# Patient Record
Sex: Female | Born: 1945 | Race: White | Hispanic: No | Marital: Married | Smoking: Never smoker
Health system: Southern US, Community
[De-identification: ages and names within clinical notes are randomized; demographics above are authoritative.]

## PROBLEM LIST (undated history)

## (undated) DIAGNOSIS — A809 Acute poliomyelitis, unspecified: Secondary | ICD-10-CM

## (undated) DIAGNOSIS — M1990 Unspecified osteoarthritis, unspecified site: Secondary | ICD-10-CM

## (undated) DIAGNOSIS — C801 Malignant (primary) neoplasm, unspecified: Secondary | ICD-10-CM

## (undated) DIAGNOSIS — I10 Essential (primary) hypertension: Secondary | ICD-10-CM

## (undated) HISTORY — PX: OTHER SURGICAL HISTORY: SHX169

## (undated) HISTORY — PX: CARDIAC SURGERY: SHX584

## (undated) HISTORY — PX: CATARACT EXTRACTION: SUR2

---

## 2019-10-01 ENCOUNTER — Emergency Department
Admission: EM | Admit: 2019-10-01 | Discharge: 2019-10-02 | Disposition: A | Discharge status: Other Acute Inpatient Hospital | Payer: Medicare Other | Attending: Student in an Organized Health Care Education/Training Program | Admitting: Student in an Organized Health Care Education/Training Program

## 2019-10-01 ENCOUNTER — Emergency Department: Payer: Medicare Other

## 2019-10-01 ENCOUNTER — Other Ambulatory Visit: Payer: Self-pay

## 2019-10-01 DIAGNOSIS — S8992XA Unspecified injury of left lower leg, initial encounter: Secondary | ICD-10-CM | POA: Diagnosis present

## 2019-10-01 DIAGNOSIS — S72492A Other fracture of lower end of left femur, initial encounter for closed fracture: Secondary | ICD-10-CM | POA: Diagnosis not present

## 2019-10-01 DIAGNOSIS — Z20828 Contact with and (suspected) exposure to other viral communicable diseases: Secondary | ICD-10-CM | POA: Insufficient documentation

## 2019-10-01 DIAGNOSIS — Y999 Unspecified external cause status: Secondary | ICD-10-CM | POA: Insufficient documentation

## 2019-10-01 DIAGNOSIS — Z85528 Personal history of other malignant neoplasm of kidney: Secondary | ICD-10-CM | POA: Diagnosis not present

## 2019-10-01 DIAGNOSIS — W010XXA Fall on same level from slipping, tripping and stumbling without subsequent striking against object, initial encounter: Secondary | ICD-10-CM | POA: Insufficient documentation

## 2019-10-01 DIAGNOSIS — I10 Essential (primary) hypertension: Secondary | ICD-10-CM | POA: Insufficient documentation

## 2019-10-01 DIAGNOSIS — Y9389 Activity, other specified: Secondary | ICD-10-CM | POA: Insufficient documentation

## 2019-10-01 DIAGNOSIS — Y92009 Unspecified place in unspecified non-institutional (private) residence as the place of occurrence of the external cause: Secondary | ICD-10-CM | POA: Insufficient documentation

## 2019-10-01 DIAGNOSIS — S72402A Unspecified fracture of lower end of left femur, initial encounter for closed fracture: Secondary | ICD-10-CM | POA: Insufficient documentation

## 2019-10-01 HISTORY — DX: Malignant (primary) neoplasm, unspecified: C80.1

## 2019-10-01 HISTORY — DX: Unspecified osteoarthritis, unspecified site: M19.90

## 2019-10-01 HISTORY — DX: Essential (primary) hypertension: I10

## 2019-10-01 HISTORY — DX: Acute poliomyelitis, unspecified: A80.9

## 2019-10-01 LAB — TROPONIN I (HIGH SENSITIVITY)
Troponin I (High Sensitivity): 6 ng/L (ref <18)
Troponin I (High Sensitivity): 8 ng/L (ref <18)

## 2019-10-01 LAB — CBC WITH DIFFERENTIAL/PLATELET
Abs Immature Granulocytes: 0.01 10*3/uL (ref 0.00–0.07)
Basophils Absolute: 0 10*3/uL (ref 0.0–0.1)
Basophils Relative: 0 %
Eosinophils Absolute: 0.2 10*3/uL (ref 0.0–0.5)
Eosinophils Relative: 2 %
HCT: 47.7 % — ABNORMAL HIGH (ref 36.0–46.0)
Hemoglobin: 15.2 g/dL — ABNORMAL HIGH (ref 12.0–15.0)
Immature Granulocytes: 0 %
Lymphocytes Relative: 26 %
Lymphs Abs: 2.1 10*3/uL (ref 0.7–4.0)
MCH: 29.7 pg (ref 26.0–34.0)
MCHC: 31.9 g/dL (ref 30.0–36.0)
MCV: 93.3 fL (ref 80.0–100.0)
Monocytes Absolute: 0.7 10*3/uL (ref 0.1–1.0)
Monocytes Relative: 9 %
Neutro Abs: 5 10*3/uL (ref 1.7–7.7)
Neutrophils Relative %: 63 %
Platelets: 144 10*3/uL — ABNORMAL LOW (ref 150–400)
RBC: 5.11 MIL/uL (ref 3.87–5.11)
RDW: 13.1 % (ref 11.5–15.5)
WBC: 8 10*3/uL (ref 4.0–10.5)
nRBC: 0 % (ref 0.0–0.2)

## 2019-10-01 LAB — RESPIRATORY PANEL BY RT PCR (FLU A&B, COVID)
Influenza A by PCR: NEGATIVE
Influenza B by PCR: NEGATIVE
SARS Coronavirus 2 by RT PCR: NEGATIVE

## 2019-10-01 LAB — BASIC METABOLIC PANEL
Anion gap: 12 (ref 5–15)
BUN: 23 mg/dL (ref 8–23)
CO2: 24 mmol/L (ref 22–32)
Calcium: 10.4 mg/dL — ABNORMAL HIGH (ref 8.9–10.3)
Chloride: 105 mmol/L (ref 98–111)
Creatinine, Ser: 0.86 mg/dL (ref 0.44–1.00)
GFR calc Af Amer: >60 mL/min (ref >60)
GFR calc non Af Amer: >60 mL/min (ref >60)
Glucose, Bld: 124 mg/dL — ABNORMAL HIGH (ref 70–99)
Potassium: 4.2 mmol/L (ref 3.5–5.1)
Sodium: 141 mmol/L (ref 135–145)

## 2019-10-01 MED ORDER — MORPHINE SULFATE (PF) 4 MG/ML IV SOLN
4.0000 mg | INTRAVENOUS | Status: DC | PRN
  Administered 2019-10-01: 4 mg via INTRAVENOUS
  Filled 2019-10-01: qty 1

## 2019-10-01 MED ORDER — ONDANSETRON 4 MG PO TBDP
4.0000 mg | ORAL_TABLET | Freq: Once | ORAL | Status: AC
  Administered 2019-10-01: 4 mg via ORAL
  Filled 2019-10-01: qty 1

## 2019-10-01 MED ORDER — HYDROMORPHONE HCL 1 MG/ML IJ SOLN
0.5000 mg | Freq: Once | INTRAMUSCULAR | Status: AC
  Administered 2019-10-01: 0.5 mg via INTRAVENOUS
  Filled 2019-10-01: qty 1

## 2019-10-01 NOTE — Plan of Care (Signed)
Case discussed with Dr. Quentin Cornwall in ED and patient accepted to hospitalist service at [facility].     Patient is a 76 yof with hx of RCC s/p left nephrectomy in 2016, childhood polio, and hypertension who presented with left knee pain and inability to bear weight after a mechanical fall.   She had been in her usual state before the fall. Found to have distal left femur fracture. There were no anginal complaints but non-specific repolarization abnormalities noted on EKG with no priors available and so HS troponin was checked and normal.   Ortho was consulted by ED physician and Dr. Lucia Gaskins requested admission to hospitalist service and indicated plan for OR on 12/31.

## 2019-10-01 NOTE — ED Triage Notes (Signed)
Pt to ED via EMS from home. Pt arrives post mechanical fall, pt c/o left knee pain. Pt was unable to bear weight on extremity after fall. Pt has hx of polio, with deformity of left leg (shortneing and rotation) no new deformity noted per pt. Per ems pts bp was 201/176 on arrival, pt currently at 190/84.

## 2019-10-01 NOTE — ED Provider Notes (Signed)
[facility] Emergency Department Provider Note    First MD Initiated Contact with Patient 10/01/19 1952     (approximate)  I have reviewed the triage vital signs and the nursing notes.   HISTORY  Chief Complaint Fall    HPI Monica Garrison is a 73 y.o. female listed past medical history presents the ER after mechanical fall.  States she was getting up at her desk to use restroom and tripped over a knitting bag falling landing on her left knee.  Did not hit her head.  Says the pain is mild to moderate in severity.  Is not any blood thinners.  Was unable to bear weight secondary to pain.    Past Medical History:  Diagnosis Date  . Arthritis   . Cancer [facility])    renal cancer  . Hypertension   . Polio    No family history on file.  Patient Active Problem List   Diagnosis Date Noted  . Closed fracture of left distal femur ([place])       Prior to Admission medications   Not on File    Allergies Heparin    Social History Social History   Tobacco Use  . Smoking status: Never Smoker  . Smokeless tobacco: Never Used  Substance Use Topics  . Alcohol use: Never  . Drug use: Never    Review of Systems Patient denies headaches, rhinorrhea, blurry vision, numbness, shortness of breath, chest pain, edema, cough, abdominal pain, nausea, vomiting, diarrhea, dysuria, fevers, rashes or hallucinations unless otherwise stated above in HPI. ____________________________________________   PHYSICAL EXAM:  VITAL SIGNS: Vitals:   10/01/19 2134 10/01/19 2230  BP: (!) 164/71 (!) 167/81  Pulse: 64 65  Resp: 20 13  Temp:    SpO2: 99% 95%    Constitutional: Alert and oriented.  Eyes: Conjunctivae are normal.  Head: Atraumatic. Nose: No congestion/rhinnorhea. Mouth/Throat: Mucous membranes are moist.   Neck: No stridor. Painless ROM.  Cardiovascular: Normal rate, regular rhythm. Grossly normal heart sounds.  Good peripheral  circulation. Respiratory: Normal respiratory effort.  No retractions. Lungs CTAB. Gastrointestinal: Soft and nontender. No distention. No abdominal bruits. No CVA tenderness. Genitourinary:  Musculoskeletal: rle non tender.  lle with knee effusion, and proximal contusion with valgus deformity,  Compartments soft and compressible.  Strong PD and PT pulse BLE.  Sensation intact Neurologic:  Normal speech and language. No gross focal neurologic deficits are appreciated. No facial droop Skin:  Skin is warm, dry and intact. No rash noted. Psychiatric: Mood and affect are normal. Speech and behavior are normal.  ____________________________________________   LABS (all labs ordered are listed, but only abnormal results are displayed)  Results for orders placed or performed during the hospital encounter of 10/01/19 (from the past 24 hour(s))  CBC with Differential     Status: Abnormal   Collection Time: 10/01/19  7:50 PM  Result Value Ref Range   WBC 8.0 4.0 - 10.5 K/uL   RBC 5.11 3.87 - 5.11 MIL/uL   Hemoglobin 15.2 (H) 12.0 - 15.0 g/dL   HCT 47.7 (H) 36.0 - 46.0 %   MCV 93.3 80.0 - 100.0 fL   MCH 29.7 26.0 - 34.0 pg   MCHC 31.9 30.0 - 36.0 g/dL   RDW 13.1 11.5 - 15.5 %   Platelets 144 (L) 150 - 400 K/uL   nRBC 0.0 0.0 - 0.2 %   Neutrophils Relative % 63 %   Neutro Abs 5.0 1.7 - 7.7 K/uL  Lymphocytes Relative 26 %   Lymphs Abs 2.1 0.7 - 4.0 K/uL   Monocytes Relative 9 %   Monocytes Absolute 0.7 0.1 - 1.0 K/uL   Eosinophils Relative 2 %   Eosinophils Absolute 0.2 0.0 - 0.5 K/uL   Basophils Relative 0 %   Basophils Absolute 0.0 0.0 - 0.1 K/uL   Immature Granulocytes 0 %   Abs Immature Granulocytes 0.01 0.00 - 0.07 K/uL  Basic metabolic panel     Status: Abnormal   Collection Time: 10/01/19  7:50 PM  Result Value Ref Range   Sodium 141 135 - 145 mmol/L   Potassium 4.2 3.5 - 5.1 mmol/L   Chloride 105 98 - 111 mmol/L   CO2 24 22 - 32 mmol/L   Glucose, Bld 124 (H) 70 - 99 mg/dL    BUN 23 8 - 23 mg/dL   Creatinine, Ser 0.86 0.44 - 1.00 mg/dL   Calcium 10.4 (H) 8.9 - 10.3 mg/dL   GFR calc non Af Amer >60 >60 mL/min   GFR calc Af Amer >60 >60 mL/min   Anion gap 12 5 - 15  Troponin I (High Sensitivity)     Status: None   Collection Time: 10/01/19  7:50 PM  Result Value Ref Range   Troponin I (High Sensitivity) 6 <18 ng/L  Troponin I (High Sensitivity)     Status: None   Collection Time: 10/01/19  9:10 PM  Result Value Ref Range   Troponin I (High Sensitivity) 8 <18 ng/L  Respiratory Panel by RT PCR (Flu A&B, Covid) - Nasopharyngeal Swab     Status: None   Collection Time: 10/01/19 10:09 PM   Specimen: Nasopharyngeal Swab  Result Value Ref Range   SARS Coronavirus 2 by RT PCR NEGATIVE NEGATIVE   Influenza A by PCR NEGATIVE NEGATIVE   Influenza B by PCR NEGATIVE NEGATIVE   ____________________________________________  EKG My review and personal interpretation at Time: 19:40   Indication: fall  Rate: 60  Rhythm: sinus Axis: normal Other: lateral t wave inversions ____________________________________________  RADIOLOGY  I personally reviewed all radiographic images ordered to evaluate for the above acute complaints and reviewed radiology reports and findings.  These findings were personally discussed with the patient.  Please see medical record for radiology report.  ____________________________________________   PROCEDURES  Procedure(s) performed:  Procedures    Critical Care performed: no ____________________________________________   INITIAL IMPRESSION / ASSESSMENT AND PLAN / ED COURSE  Pertinent labs & imaging results that were available during my care of the patient were reviewed by me and considered in my medical decision making (see chart for details).   DDX: fracture, dislocation, contusion, dislocation  Monica Garrison is a 73 y.o. who presents to the ED with injury to the left knee he has described above.  Compartments soft.  Good  distal perfusion.  X-ray will be ordered for above differential. The patient will be placed on continuous pulse oximetry and telemetry for monitoring.  Laboratory evaluation will be sent to evaluate for the above complaints.     Clinical Course as of Oct 01 2323  Wed Oct 01, 2019  2114 Discussed case in consultation with Dr. Posey Pronto of orthopedics.  Given the intra-articular involvement concern patient will require transfer to facility with dedicated Ortho trauma.   [PR]    Clinical Course User Index [PR] Merlyn Lot, MD    The patient was evaluated in Emergency Department today for the symptoms described in the history of present illness. He/she  was evaluated in the context of the global COVID-19 pandemic, which necessitated consideration that the patient might be at risk for infection with the SARS-CoV-2 virus that causes COVID-19. Institutional protocols and algorithms that pertain to the evaluation of patients at risk for COVID-19 are in a state of rapid change based on information released by regulatory bodies including the CDC and federal and state organizations. These policies and algorithms were followed during the patient's care in the ED.  As part of my medical decision making, I reviewed the following data within the [place] notes reviewed and incorporated, Labs reviewed, notes from prior ED visits and [place] Controlled Substance Database   ____________________________________________   FINAL CLINICAL IMPRESSION(S) / ED DIAGNOSES  Final diagnoses:  Other closed fracture of distal end of left femur, initial encounter ([place])      NEW MEDICATIONS STARTED DURING THIS VISIT:  New Prescriptions   No medications on file     Note:  This document was prepared using Dragon voice recognition software and may include unintentional dictation errors.    Merlyn Lot, MD 10/01/19 [phone]

## 2019-10-01 NOTE — ED Notes (Signed)
PT GIVES VERBAL CONSENT FOR TRANSFER

## 2019-10-01 NOTE — ED Notes (Signed)
Patient transported to X-ray 

## 2019-10-02 ENCOUNTER — Inpatient Hospital Stay (HOSPITAL_COMMUNITY): Payer: Medicare Other | Admitting: Registered Nurse

## 2019-10-02 ENCOUNTER — Inpatient Hospital Stay (HOSPITAL_COMMUNITY): Payer: Medicare Other

## 2019-10-02 ENCOUNTER — Inpatient Hospital Stay (HOSPITAL_COMMUNITY)
Admission: AD | Admit: 2019-10-02 | Discharge: 2019-10-07 | DRG: 481 | Disposition: A | Discharge status: Skilled Nursing Facility | Payer: Medicare Other | Source: Other Acute Inpatient Hospital | Attending: Internal Medicine | Admitting: Internal Medicine

## 2019-10-02 ENCOUNTER — Encounter (HOSPITAL_COMMUNITY): Payer: Self-pay | Admitting: Family Medicine

## 2019-10-02 ENCOUNTER — Encounter (HOSPITAL_COMMUNITY)
Admission: AD | Disposition: A | Discharge status: Skilled Nursing Facility | Payer: Self-pay | Attending: Internal Medicine

## 2019-10-02 DIAGNOSIS — W010XXA Fall on same level from slipping, tripping and stumbling without subsequent striking against object, initial encounter: Secondary | ICD-10-CM | POA: Diagnosis present

## 2019-10-02 DIAGNOSIS — S72402A Unspecified fracture of lower end of left femur, initial encounter for closed fracture: Secondary | ICD-10-CM

## 2019-10-02 DIAGNOSIS — S7292XD Unspecified fracture of left femur, subsequent encounter for closed fracture with routine healing: Secondary | ICD-10-CM | POA: Diagnosis not present

## 2019-10-02 DIAGNOSIS — M25552 Pain in left hip: Secondary | ICD-10-CM | POA: Diagnosis present

## 2019-10-02 DIAGNOSIS — S82002A Unspecified fracture of left patella, initial encounter for closed fracture: Secondary | ICD-10-CM | POA: Diagnosis present

## 2019-10-02 DIAGNOSIS — Z953 Presence of xenogenic heart valve: Secondary | ICD-10-CM | POA: Diagnosis not present

## 2019-10-02 DIAGNOSIS — I160 Hypertensive urgency: Secondary | ICD-10-CM | POA: Diagnosis present

## 2019-10-02 DIAGNOSIS — G14 Postpolio syndrome: Secondary | ICD-10-CM | POA: Diagnosis not present

## 2019-10-02 DIAGNOSIS — Z905 Acquired absence of kidney: Secondary | ICD-10-CM

## 2019-10-02 DIAGNOSIS — S72422A Displaced fracture of lateral condyle of left femur, initial encounter for closed fracture: Principal | ICD-10-CM | POA: Diagnosis present

## 2019-10-02 DIAGNOSIS — Z79899 Other long term (current) drug therapy: Secondary | ICD-10-CM | POA: Diagnosis not present

## 2019-10-02 DIAGNOSIS — E559 Vitamin D deficiency, unspecified: Secondary | ICD-10-CM | POA: Diagnosis present

## 2019-10-02 DIAGNOSIS — Z01818 Encounter for other preprocedural examination: Secondary | ICD-10-CM

## 2019-10-02 DIAGNOSIS — Z20822 Contact with and (suspected) exposure to covid-19: Secondary | ICD-10-CM | POA: Diagnosis present

## 2019-10-02 DIAGNOSIS — D62 Acute posthemorrhagic anemia: Secondary | ICD-10-CM | POA: Diagnosis not present

## 2019-10-02 DIAGNOSIS — Y92009 Unspecified place in unspecified non-institutional (private) residence as the place of occurrence of the external cause: Secondary | ICD-10-CM | POA: Diagnosis not present

## 2019-10-02 DIAGNOSIS — I10 Essential (primary) hypertension: Secondary | ICD-10-CM

## 2019-10-02 DIAGNOSIS — Z85528 Personal history of other malignant neoplasm of kidney: Secondary | ICD-10-CM | POA: Diagnosis not present

## 2019-10-02 DIAGNOSIS — Z419 Encounter for procedure for purposes other than remedying health state, unspecified: Secondary | ICD-10-CM

## 2019-10-02 DIAGNOSIS — T148XXA Other injury of unspecified body region, initial encounter: Secondary | ICD-10-CM

## 2019-10-02 HISTORY — PX: ORIF FEMUR FRACTURE: SHX2119

## 2019-10-02 LAB — BASIC METABOLIC PANEL
Anion gap: 7 (ref 5–15)
BUN: 21 mg/dL (ref 8–23)
CO2: 27 mmol/L (ref 22–32)
Calcium: 10 mg/dL (ref 8.9–10.3)
Chloride: 106 mmol/L (ref 98–111)
Creatinine, Ser: 0.88 mg/dL (ref 0.44–1.00)
GFR calc Af Amer: >60 mL/min (ref >60)
GFR calc non Af Amer: >60 mL/min (ref >60)
Glucose, Bld: 152 mg/dL — ABNORMAL HIGH (ref 70–99)
Potassium: 4.6 mmol/L (ref 3.5–5.1)
Sodium: 140 mmol/L (ref 135–145)

## 2019-10-02 LAB — CBC
HCT: 40.7 % (ref 36.0–46.0)
Hemoglobin: 13.4 g/dL (ref 12.0–15.0)
MCH: 30.8 pg (ref 26.0–34.0)
MCHC: 32.9 g/dL (ref 30.0–36.0)
MCV: 93.6 fL (ref 80.0–100.0)
Platelets: 148 10*3/uL — ABNORMAL LOW (ref 150–400)
RBC: 4.35 MIL/uL (ref 3.87–5.11)
RDW: 13.1 % (ref 11.5–15.5)
WBC: 10.6 10*3/uL — ABNORMAL HIGH (ref 4.0–10.5)
nRBC: 0 % (ref 0.0–0.2)

## 2019-10-02 LAB — ABO/RH: ABO/RH(D): O POS

## 2019-10-02 LAB — TYPE AND SCREEN
ABO/RH(D): O POS
Antibody Screen: NEGATIVE

## 2019-10-02 LAB — MRSA PCR SCREENING: MRSA by PCR: NEGATIVE

## 2019-10-02 SURGERY — OPEN REDUCTION INTERNAL FIXATION (ORIF) DISTAL FEMUR FRACTURE
Anesthesia: General | Laterality: Left

## 2019-10-02 MED ORDER — INFLUENZA VAC A&B SA ADJ QUAD 0.5 ML IM PRSY
0.5000 mL | PREFILLED_SYRINGE | INTRAMUSCULAR | Status: DC
  Filled 2019-10-02: qty 0.5

## 2019-10-02 MED ORDER — PROPOFOL 10 MG/ML IV BOLUS
INTRAVENOUS | Status: DC | PRN
  Administered 2019-10-02: 100 mg via INTRAVENOUS
  Administered 2019-10-02: 50 mg via INTRAVENOUS

## 2019-10-02 MED ORDER — CHLORHEXIDINE GLUCONATE 4 % EX LIQD: 60.0000 mL | Freq: Once | CUTANEOUS | Status: DC

## 2019-10-02 MED ORDER — VANCOMYCIN HCL 1000 MG IV SOLR
INTRAVENOUS | Status: DC | PRN
  Administered 2019-10-02: 1000 mg via TOPICAL

## 2019-10-02 MED ORDER — APIXABAN 2.5 MG PO TABS
2.5000 mg | ORAL_TABLET | Freq: Two times a day (BID) | ORAL | Status: DC
  Administered 2019-10-03 – 2019-10-07 (×9): 2.5 mg via ORAL
  Filled 2019-10-02 (×9): qty 1

## 2019-10-02 MED ORDER — PHENYLEPHRINE HCL-NACL 10-0.9 MG/250ML-% IV SOLN
INTRAVENOUS | Status: DC | PRN
  Administered 2019-10-02: 30 ug/min via INTRAVENOUS

## 2019-10-02 MED ORDER — FENTANYL CITRATE (PF) 250 MCG/5ML IJ SOLN
INTRAMUSCULAR | Status: AC
  Filled 2019-10-02: qty 5

## 2019-10-02 MED ORDER — FENTANYL CITRATE (PF) 250 MCG/5ML IJ SOLN
INTRAMUSCULAR | Status: DC | PRN
  Administered 2019-10-02 (×2): 50 ug via INTRAVENOUS
  Administered 2019-10-02: 100 ug via INTRAVENOUS

## 2019-10-02 MED ORDER — CHLORHEXIDINE GLUCONATE CLOTH 2 % EX PADS
6.0000 | MEDICATED_PAD | Freq: Every day | CUTANEOUS | Status: DC
  Administered 2019-10-02 – 2019-10-05 (×4): 6 via TOPICAL

## 2019-10-02 MED ORDER — 0.9 % SODIUM CHLORIDE (POUR BTL) OPTIME
TOPICAL | Status: DC | PRN
  Administered 2019-10-02: 1000 mL

## 2019-10-02 MED ORDER — PHENYLEPHRINE 40 MCG/ML (10ML) SYRINGE FOR IV PUSH (FOR BLOOD PRESSURE SUPPORT)
PREFILLED_SYRINGE | INTRAVENOUS | Status: AC
  Filled 2019-10-02: qty 10

## 2019-10-02 MED ORDER — VANCOMYCIN HCL 1000 MG IV SOLR
INTRAVENOUS | Status: AC
  Filled 2019-10-02: qty 1000

## 2019-10-02 MED ORDER — ACETAMINOPHEN 325 MG PO TABS
650.0000 mg | ORAL_TABLET | Freq: Four times a day (QID) | ORAL | Status: DC | PRN
  Filled 2019-10-02: qty 2

## 2019-10-02 MED ORDER — SODIUM CHLORIDE 0.9 % IV SOLN: INTRAVENOUS | Status: AC

## 2019-10-02 MED ORDER — LACTATED RINGERS IV SOLN: INTRAVENOUS | Status: DC

## 2019-10-02 MED ORDER — ONDANSETRON HCL 4 MG/2ML IJ SOLN
INTRAMUSCULAR | Status: AC
  Filled 2019-10-02: qty 2

## 2019-10-02 MED ORDER — CEFAZOLIN SODIUM-DEXTROSE 2-4 GM/100ML-% IV SOLN
2.0000 g | INTRAVENOUS | Status: AC
  Administered 2019-10-02: 2 g via INTRAVENOUS

## 2019-10-02 MED ORDER — CEFAZOLIN SODIUM-DEXTROSE 2-4 GM/100ML-% IV SOLN
INTRAVENOUS | Status: AC
  Filled 2019-10-02: qty 100

## 2019-10-02 MED ORDER — METOPROLOL TARTRATE 50 MG PO TABS
50.0000 mg | ORAL_TABLET | Freq: Two times a day (BID) | ORAL | Status: DC
  Administered 2019-10-02 – 2019-10-07 (×10): 50 mg via ORAL
  Filled 2019-10-02 (×10): qty 1

## 2019-10-02 MED ORDER — HYDROMORPHONE HCL 1 MG/ML IJ SOLN
0.2500 mg | INTRAMUSCULAR | Status: DC | PRN
  Administered 2019-10-02 (×2): 0.5 mg via INTRAVENOUS

## 2019-10-02 MED ORDER — TRAMADOL HCL 50 MG PO TABS
50.0000 mg | ORAL_TABLET | Freq: Four times a day (QID) | ORAL | Status: DC | PRN
  Administered 2019-10-02 – 2019-10-03 (×2): 100 mg via ORAL
  Filled 2019-10-02 (×2): qty 2

## 2019-10-02 MED ORDER — DEXAMETHASONE SODIUM PHOSPHATE 10 MG/ML IJ SOLN
INTRAMUSCULAR | Status: AC
  Filled 2019-10-02: qty 1

## 2019-10-02 MED ORDER — ROCURONIUM BROMIDE 10 MG/ML (PF) SYRINGE
PREFILLED_SYRINGE | INTRAVENOUS | Status: DC | PRN
  Administered 2019-10-02: 50 mg via INTRAVENOUS

## 2019-10-02 MED ORDER — METOPROLOL TARTRATE 50 MG PO TABS: 50.0000 mg | ORAL_TABLET | Freq: Once | ORAL | Status: AC

## 2019-10-02 MED ORDER — HYDRALAZINE HCL 25 MG PO TABS: 25.0000 mg | ORAL_TABLET | Freq: Four times a day (QID) | ORAL | Status: DC | PRN

## 2019-10-02 MED ORDER — DEXAMETHASONE SODIUM PHOSPHATE 10 MG/ML IJ SOLN
INTRAMUSCULAR | Status: DC | PRN
  Administered 2019-10-02: 5 mg via INTRAVENOUS

## 2019-10-02 MED ORDER — ACETAMINOPHEN 325 MG PO TABS
650.0000 mg | ORAL_TABLET | Freq: Four times a day (QID) | ORAL | Status: DC
  Administered 2019-10-02 – 2019-10-07 (×15): 650 mg via ORAL
  Filled 2019-10-02 (×16): qty 2

## 2019-10-02 MED ORDER — SUGAMMADEX SODIUM 200 MG/2ML IV SOLN
INTRAVENOUS | Status: DC | PRN
  Administered 2019-10-02 (×2): 100 mg via INTRAVENOUS

## 2019-10-02 MED ORDER — LIDOCAINE 2% (20 MG/ML) 5 ML SYRINGE
INTRAMUSCULAR | Status: AC
  Filled 2019-10-02: qty 5

## 2019-10-02 MED ORDER — CEFAZOLIN SODIUM-DEXTROSE 2-4 GM/100ML-% IV SOLN
2.0000 g | Freq: Three times a day (TID) | INTRAVENOUS | Status: AC
  Administered 2019-10-02 – 2019-10-03 (×3): 2 g via INTRAVENOUS
  Filled 2019-10-02 (×3): qty 100

## 2019-10-02 MED ORDER — PROMETHAZINE HCL 25 MG/ML IJ SOLN: 6.2500 mg | INTRAMUSCULAR | Status: DC | PRN

## 2019-10-02 MED ORDER — METHOCARBAMOL 1000 MG/10ML IJ SOLN
500.0000 mg | Freq: Four times a day (QID) | INTRAVENOUS | Status: DC | PRN
  Filled 2019-10-02: qty 5

## 2019-10-02 MED ORDER — ONDANSETRON HCL 4 MG/2ML IJ SOLN: 4.0000 mg | Freq: Four times a day (QID) | INTRAMUSCULAR | Status: DC | PRN

## 2019-10-02 MED ORDER — PHENYLEPHRINE 40 MCG/ML (10ML) SYRINGE FOR IV PUSH (FOR BLOOD PRESSURE SUPPORT)
PREFILLED_SYRINGE | INTRAVENOUS | Status: DC | PRN
  Administered 2019-10-02: 40 ug via INTRAVENOUS
  Administered 2019-10-02: 80 ug via INTRAVENOUS

## 2019-10-02 MED ORDER — PROPOFOL 10 MG/ML IV BOLUS
INTRAVENOUS | Status: AC
  Filled 2019-10-02: qty 20

## 2019-10-02 MED ORDER — SODIUM CHLORIDE 0.9 % IV SOLN: INTRAVENOUS | Status: DC

## 2019-10-02 MED ORDER — LIDOCAINE 2% (20 MG/ML) 5 ML SYRINGE
INTRAMUSCULAR | Status: DC | PRN
  Administered 2019-10-02: 100 mg via INTRAVENOUS

## 2019-10-02 MED ORDER — MORPHINE SULFATE (PF) 2 MG/ML IV SOLN: 1.0000 mg | INTRAVENOUS | Status: DC | PRN

## 2019-10-02 MED ORDER — METHOCARBAMOL 500 MG PO TABS
500.0000 mg | ORAL_TABLET | Freq: Four times a day (QID) | ORAL | Status: DC | PRN
  Filled 2019-10-02: qty 1

## 2019-10-02 MED ORDER — ONDANSETRON HCL 4 MG/2ML IJ SOLN
INTRAMUSCULAR | Status: DC | PRN
  Administered 2019-10-02: 4 mg via INTRAVENOUS

## 2019-10-02 MED ORDER — MORPHINE SULFATE (PF) 2 MG/ML IV SOLN
1.0000 mg | INTRAVENOUS | Status: DC | PRN
  Administered 2019-10-02: 2 mg via INTRAVENOUS
  Filled 2019-10-02: qty 1

## 2019-10-02 MED ORDER — POLYETHYLENE GLYCOL 3350 17 G PO PACK: 17.0000 g | PACK | Freq: Every day | ORAL | Status: DC | PRN

## 2019-10-02 MED ORDER — POVIDONE-IODINE 10 % EX SWAB: 2.0000 "application " | Freq: Once | CUTANEOUS | Status: DC

## 2019-10-02 MED ORDER — METOPROLOL TARTRATE 50 MG PO TABS
ORAL_TABLET | ORAL | Status: AC
  Administered 2019-10-02: 14:00:00 50 mg via ORAL
  Filled 2019-10-02: qty 1

## 2019-10-02 MED ORDER — GABAPENTIN 100 MG PO CAPS
100.0000 mg | ORAL_CAPSULE | Freq: Three times a day (TID) | ORAL | Status: DC
  Administered 2019-10-02 – 2019-10-07 (×15): 100 mg via ORAL
  Filled 2019-10-02 (×15): qty 1

## 2019-10-02 MED ORDER — HYDROMORPHONE HCL 1 MG/ML IJ SOLN
INTRAMUSCULAR | Status: AC
  Filled 2019-10-02: qty 1

## 2019-10-02 MED ORDER — ROCURONIUM BROMIDE 10 MG/ML (PF) SYRINGE
PREFILLED_SYRINGE | INTRAVENOUS | Status: AC
  Filled 2019-10-02: qty 10

## 2019-10-02 SURGICAL SUPPLY — 73 items
BIT DRILL 4.3 (BIT) ×2
BIT DRILL 4.3MM (BIT) ×1
BIT DRILL 4.3X300MM (BIT) ×1 IMPLANT
BIT DRILL LONG 3.3 (BIT) ×2 IMPLANT
BIT DRILL LONG 3.3MM (BIT) ×1
BIT DRILL QC 3.3X195 (BIT) ×3 IMPLANT
BLADE CLIPPER SURG (BLADE) IMPLANT
BNDG COHESIVE 6X5 TAN STRL LF (GAUZE/BANDAGES/DRESSINGS) ×3 IMPLANT
BNDG ELASTIC 6X10 VLCR STRL LF (GAUZE/BANDAGES/DRESSINGS) ×3 IMPLANT
BRUSH SCRUB EZ PLAIN DRY (MISCELLANEOUS) ×6 IMPLANT
CANISTER SUCT 3000ML PPV (MISCELLANEOUS) ×3 IMPLANT
CAP LOCK NCB (Cap) ×21 IMPLANT
CHLORAPREP W/TINT 26 (MISCELLANEOUS) ×3 IMPLANT
COVER SURGICAL LIGHT HANDLE (MISCELLANEOUS) ×3 IMPLANT
COVER WAND RF STERILE (DRAPES) ×3 IMPLANT
DRAPE C-ARM 42X72 X-RAY (DRAPES) ×3 IMPLANT
DRAPE C-ARMOR (DRAPES) ×3 IMPLANT
DRAPE HALF SHEET 40X57 (DRAPES) ×6 IMPLANT
DRAPE ORTHO SPLIT 77X108 STRL (DRAPES) ×4
DRAPE SURG 17X23 STRL (DRAPES) ×3 IMPLANT
DRAPE SURG ORHT 6 SPLT 77X108 (DRAPES) ×2 IMPLANT
DRAPE U-SHAPE 47X51 STRL (DRAPES) ×3 IMPLANT
DRSG ADAPTIC 3X8 NADH LF (GAUZE/BANDAGES/DRESSINGS) IMPLANT
DRSG MEPILEX BORDER 4X12 (GAUZE/BANDAGES/DRESSINGS) IMPLANT
DRSG MEPILEX BORDER 4X4 (GAUZE/BANDAGES/DRESSINGS) IMPLANT
DRSG MEPILEX BORDER 4X8 (GAUZE/BANDAGES/DRESSINGS) ×6 IMPLANT
DRSG PAD ABDOMINAL 8X10 ST (GAUZE/BANDAGES/DRESSINGS) ×9 IMPLANT
ELECT REM PT RETURN 9FT ADLT (ELECTROSURGICAL) ×3
ELECTRODE REM PT RTRN 9FT ADLT (ELECTROSURGICAL) ×1 IMPLANT
GAUZE SPONGE 4X4 12PLY STRL (GAUZE/BANDAGES/DRESSINGS) ×3 IMPLANT
GLOVE BIO SURGEON STRL SZ 6.5 (GLOVE) ×6 IMPLANT
GLOVE BIO SURGEON STRL SZ7.5 (GLOVE) ×12 IMPLANT
GLOVE BIO SURGEONS STRL SZ 6.5 (GLOVE) ×3
GLOVE BIOGEL PI IND STRL 6.5 (GLOVE) ×1 IMPLANT
GLOVE BIOGEL PI IND STRL 7.5 (GLOVE) ×1 IMPLANT
GLOVE BIOGEL PI INDICATOR 6.5 (GLOVE) ×2
GLOVE BIOGEL PI INDICATOR 7.5 (GLOVE) ×2
GOWN STRL REUS W/ TWL LRG LVL3 (GOWN DISPOSABLE) ×3 IMPLANT
GOWN STRL REUS W/TWL LRG LVL3 (GOWN DISPOSABLE) ×6
K-WIRE 2.0 (WIRE) ×4
K-WIRE FXSTD 280X2XNS SS (WIRE) ×2
K-WIRE TROC 1.25X150 (WIRE) ×6
KIT BASIN OR (CUSTOM PROCEDURE TRAY) ×3 IMPLANT
KIT TURNOVER KIT B (KITS) ×3 IMPLANT
KWIRE FXSTD 280X2XNS SS (WIRE) ×2 IMPLANT
KWIRE TROC 1.25X150 (WIRE) ×2 IMPLANT
NS IRRIG 1000ML POUR BTL (IV SOLUTION) ×3 IMPLANT
PACK TOTAL JOINT (CUSTOM PROCEDURE TRAY) ×3 IMPLANT
PAD ARMBOARD 7.5X6 YLW CONV (MISCELLANEOUS) ×6 IMPLANT
PAD CAST 4YDX4 CTTN HI CHSV (CAST SUPPLIES) ×1 IMPLANT
PADDING CAST COTTON 4X4 STRL (CAST SUPPLIES) ×2
PADDING CAST COTTON 6X4 STRL (CAST SUPPLIES) ×3 IMPLANT
PLATE DIST FEM 12H (Plate) ×3 IMPLANT
SCREW 5.0 70MM (Screw) ×12 IMPLANT
SCREW CORTICAL NCB 5.0X65 (Screw) ×3 IMPLANT
SCREW NCB 3.5X75X5X6.2XST (Screw) ×1 IMPLANT
SCREW NCB 4.0MX34M (Screw) ×3 IMPLANT
SCREW NCB 5.0X36MM (Screw) ×3 IMPLANT
SCREW NCB 5.0X75MM (Screw) ×2 IMPLANT
SPONGE LAP 18X18 RF (DISPOSABLE) IMPLANT
STAPLER VISISTAT 35W (STAPLE) ×3 IMPLANT
SUCTION FRAZIER HANDLE 10FR (MISCELLANEOUS) ×2
SUCTION TUBE FRAZIER 10FR DISP (MISCELLANEOUS) ×1 IMPLANT
SUT ETHILON 3 0 PS 1 (SUTURE) ×6 IMPLANT
SUT VIC AB 0 CT1 27 (SUTURE)
SUT VIC AB 0 CT1 27XBRD ANBCTR (SUTURE) IMPLANT
SUT VIC AB 1 CT1 27 (SUTURE)
SUT VIC AB 1 CT1 27XBRD ANBCTR (SUTURE) IMPLANT
SUT VIC AB 2-0 CT1 27 (SUTURE) ×4
SUT VIC AB 2-0 CT1 TAPERPNT 27 (SUTURE) ×2 IMPLANT
TOWEL GREEN STERILE (TOWEL DISPOSABLE) ×6 IMPLANT
TRAY FOLEY MTR SLVR 16FR STAT (SET/KITS/TRAYS/PACK) IMPLANT
WATER STERILE IRR 1000ML POUR (IV SOLUTION) ×6 IMPLANT

## 2019-10-02 NOTE — Transfer of Care (Signed)
Immediate Anesthesia Transfer of Care Note  Patient: Monica Garrison  Procedure(s) Performed: OPEN REDUCTION INTERNAL FIXATION DISTAL FEMUR FRACTURE (Left )  Patient Location: PACU  Anesthesia Type:General  Level of Consciousness: awake, alert  and oriented  Airway & Oxygen Therapy: Patient Spontanous Breathing and Patient connected to nasal cannula oxygen  Post-op Assessment: Report given to RN and Post -op Vital signs reviewed and stable  Post vital signs: Reviewed and stable  Last Vitals:  Vitals Value Taken Time  BP 156/80 10/02/19 1622  Temp 36.5 C 10/02/19 1622  Pulse 67 10/02/19 1625  Resp 11 10/02/19 1625  SpO2 100 % 10/02/19 1625  Vitals shown include unvalidated device data.  Last Pain:  Vitals:   10/02/19 1622  PainSc: 0-No pain         Complications: No apparent anesthesia complications

## 2019-10-02 NOTE — ED Notes (Signed)
EMTALA and Medical Necessity documentation reviewed at thism time and found to be complete per policy.

## 2019-10-02 NOTE — Progress Notes (Signed)
ANTICOAGULATION CONSULT NOTE - Initial Consult  Pharmacy Consult for Apixaban Indication: VTE prophylaxis s/p ORIF for L-distal femur fx  Allergies  Allergen Reactions  . Enalapril Maleate Cough    Arthralgia (severe joint pain)  . 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 Nickel Dermatitis  . Other Dermatitis    Chromium    Patient Measurements:   Heparin Dosing Weight:   Vital Signs: Temp: 98.1 F (36.7 C) (12/31 1652) Temp Source: Oral (12/31 0817) BP: 143/65 (12/31 1652) Pulse Rate: 72 (12/31 1652)  Labs: Recent Labs    10/01/19 1950 10/01/19 2110 10/02/19 0419  HGB 15.2*  --  13.4  HCT 47.7*  --  40.7  PLT 144*  --  148*  CREATININE 0.86  --  0.88  TROPONINIHS 6 8  --     Estimated Creatinine Clearance: 72.4 mL/min (by C-G formula based on SCr of 0.88 mg/dL).   Medical History: Past Medical History:  Diagnosis Date  . Arthritis   . Cancer [facility])    renal cancer  . Hypertension   . Polio     Assessment: 51 YOF s/p ORIF for L-distal femur fracture and now to start Apixaban post-op for VTE prophylaxis. The patient is noted to have history of a heparin allergy of HITT.  The patient's surgery was this afternoon - will plan to initiate in 12-18 hours and start with AM dosing on 1/1.   Goal of Therapy:  Appropriate anticoagulation for indication and hepatic/renal function    Plan:  - Start Apixaban 2.5 mg bid - first dose on 1/1 AM - Will continue to monitor for bleeding and LOT plans for educational needs  Thank you for allowing pharmacy to be a part of this patient's care.  Alycia Rossetti, PharmD, BCPS Clinical Pharmacist Clinical phone for 10/02/2019: [phone] 10/02/2019 5:33 PM   **Pharmacist phone directory can now be found on [email or web address] (PW [email or web address]).  Listed under [place].

## 2019-10-02 NOTE — Progress Notes (Signed)
No charge progress note.  Monica Garrison is a 73 y.o. female with medical history significant for childhood polio with resulting leg weakness, RCC status post left nephrectomy, bioprosthetic aortic valve replacement, HITT, and hypertension, now presenting to the emergency department with severe left knee pain and inability to bear weight after a mechanical fall at home.   Found to have impacted displaced fracture of distal femur. Orthopedic was consulted and patient will go for internal fixation later today.  Is doing well when seen this morning. Left knee was in brace. Pain was well controlled.  -Follow-up postsurgical recommendations.

## 2019-10-02 NOTE — H&P (Addendum)
History and Physical    Catana Crofton [id number] DOB: 06-Oct-1945 DOA: 10/02/2019  PCP: Gale Journey, MD   Patient coming from: Home   Chief Complaint: Fall, left knee pain   HPI: Monica Garrison is a 73 y.o. female with medical history significant for childhood polio with resulting leg weakness, RCC status post left nephrectomy, bioprosthetic aortic valve replacement, HITT, and hypertension, now presenting to the emergency department with severe left knee pain and inability to bear weight after a fall at home.  Patient reports that she had been in her usual state of health and was having an uneventful day when she tripped and fell.  She denies hitting her head or losing consciousness but experienced immediate and severe pain at the left knee and was unable to bear weight.  She has not had any recent fevers, chills, cough, or shortness of breath.  Her exercise tolerance is limited by leg weakness, but she denies any history of chest pain with exertion, uses a cane at baseline. She has some difficulty ascending a flight of stairs due to her chronic leg weakness, describes having to pull herself up with the handrail, but does not experience any angina with this level of exertion.  EMS was called out after the fall, reported that the patient was severely hypertensive, and brought her into the ED.  [facility] ED Course: Upon arrival to the [facility] ED, patient is found to be afebrile, saturating well on room air, and hypertensive to 190/80.  EKG features sinus rhythm with nonspecific repolarization abnormality and radiographs of the left knee reveal acute impacted displaced fracture of the distal femur with apparent extension of fracture plane to the articular surface.  Chemistry panel was unremarkable and CBC notable for slight polycythemia and thrombocytopenia.  High-sensitivity troponin was normal x2.  Patient was treated with analgesics in the ED, orthopedic surgeon at [facility]  accepted the patient in consultation, and recommended medical admission.  Review of Systems:  All other systems reviewed and apart from HPI, are negative.  Past Medical History:  Diagnosis Date  . Arthritis   . Cancer [facility])    renal cancer  . Hypertension   . Polio     Past Surgical History:  Procedure Laterality Date  . CARDIAC SURGERY    . kidney removal       reports that she has never smoked. She has never used smokeless tobacco. She reports that she does not drink alcohol or use drugs.  Allergies  Allergen Reactions  . Heparin     History reviewed. No pertinent family history.   Prior to Admission medications   Not on File    Physical Exam: There were no vitals filed for this visit.  Constitutional: NAD, calm  Eyes: PERTLA, lids and conjunctivae normal ENMT: Mucous membranes are moist. Posterior pharynx clear of any exudate or lesions.   Neck: normal, supple, no masses, no thyromegaly Respiratory:  no wheezing, no crackles. Normal respiratory effort. No accessory muscle use.  Cardiovascular: S1 & S2 heard, regular rate and rhythm. No extremity edema.   Abdomen: No distension, soft, non-tender. Bowel sounds active.  Musculoskeletal: no clubbing / cyanosis. Left knee Immobilized, neurovascularly intact distally.   Skin: no significant rashes, lesions, ulcers. Warm, dry, well-perfused. Neurologic: No facial asymmetry. Sensation intact. Moving all extremities.  Psychiatric: Alert and oriented to person, place, and situation. Pleasant, cooperative.     Labs on Admission: I have personally reviewed following labs and imaging studies  CBC: Recent  Labs  Lab 10/01/19 1950  WBC 8.0  NEUTROABS 5.0  HGB 15.2*  HCT 47.7*  MCV 93.3  PLT 123456*   Basic Metabolic Panel: Recent Labs  Lab 10/01/19 1950  NA 141  K 4.2  CL 105  CO2 24  GLUCOSE 124*  BUN 23  CREATININE 0.86  CALCIUM 10.4*   GFR: Estimated Creatinine Clearance: 74.1 mL/min (by C-G formula  based on SCr of 0.86 mg/dL). Liver Function Tests: No results for input(s): AST, ALT, ALKPHOS, BILITOT, PROT, ALBUMIN in the last 168 hours. No results for input(s): LIPASE, AMYLASE in the last 168 hours. No results for input(s): AMMONIA in the last 168 hours. Coagulation Profile: No results for input(s): INR, PROTIME in the last 168 hours. Cardiac Enzymes: No results for input(s): CKTOTAL, CKMB, CKMBINDEX, TROPONINI in the last 168 hours. BNP (last 3 results) No results for input(s): PROBNP in the last 8760 hours. HbA1C: No results for input(s): HGBA1C in the last 72 hours. CBG: No results for input(s): GLUCAP in the last 168 hours. Lipid Profile: No results for input(s): CHOL, HDL, LDLCALC, TRIG, CHOLHDL, LDLDIRECT in the last 72 hours. Thyroid Function Tests: No results for input(s): TSH, T4TOTAL, FREET4, T3FREE, THYROIDAB in the last 72 hours. Anemia Panel: No results for input(s): VITAMINB12, FOLATE, FERRITIN, TIBC, IRON, RETICCTPCT in the last 72 hours. Urine analysis: No results found for: COLORURINE, APPEARANCEUR, LABSPEC, PHURINE, GLUCOSEU, HGBUR, BILIRUBINUR, KETONESUR, PROTEINUR, UROBILINOGEN, NITRITE, LEUKOCYTESUR Sepsis Labs: @LABRCNTIP (procalcitonin:4,lacticidven:4) ) Recent Results (from the past 240 hour(s))  Respiratory Panel by RT PCR (Flu A&B, Covid) - Nasopharyngeal Swab     Status: None   Collection Time: 10/01/19 10:09 PM   Specimen: Nasopharyngeal Swab  Result Value Ref Range Status   SARS Coronavirus 2 by RT PCR NEGATIVE NEGATIVE Final    Comment: (NOTE) SARS-CoV-2 target nucleic acids are NOT DETECTED. The SARS-CoV-2 RNA is generally detectable in upper respiratoy specimens during the acute phase of infection. The lowest concentration of SARS-CoV-2 viral copies this assay can detect is 131 copies/mL. A negative result does not preclude SARS-Cov-2 infection and should not be used as the sole basis for treatment or other patient management decisions. A  negative result may occur with  improper specimen collection/handling, submission of specimen other than nasopharyngeal swab, presence of viral mutation(s) within the areas targeted by this assay, and inadequate number of viral copies (<131 copies/mL). A negative result must be combined with clinical observations, patient history, and epidemiological information. The expected result is Negative. Fact Sheet for Patients:  PinkCheek.be Fact Sheet for Healthcare Providers:  GravelBags.it This test is not yet ap proved or cleared by the [place] FDA and  has been authorized for detection and/or diagnosis of SARS-CoV-2 by FDA under an Emergency Use Authorization (EUA). This EUA will remain  in effect (meaning this test can be used) for the duration of the COVID-19 declaration under Section 564(b)(1) of the Act, 21 U.S.C. section 360bbb-3(b)(1), unless the authorization is terminated or revoked sooner.    Influenza A by PCR NEGATIVE NEGATIVE Final   Influenza B by PCR NEGATIVE NEGATIVE Final    Comment: (NOTE) The Xpert Xpress SARS-CoV-2/FLU/RSV assay is intended as an aid in  the diagnosis of influenza from Nasopharyngeal swab specimens and  should not be used as a sole basis for treatment. Nasal washings and  aspirates are unacceptable for Xpert Xpress SARS-CoV-2/FLU/RSV  testing. Fact Sheet for Patients: PinkCheek.be Fact Sheet for Healthcare Providers: GravelBags.it This test is not yet approved or cleared  by the [place] and  has been authorized for detection and/or diagnosis of SARS-CoV-2 by  FDA under an Emergency Use Authorization (EUA). This EUA will remain  in effect (meaning this test can be used) for the duration of the  Covid-19 declaration under Section 564(b)(1) of the Act, 21  U.S.C. section 360bbb-3(b)(1), unless the authorization is    terminated or revoked. Performed at [facility], [address] [place], [place] [address]      Radiological Exams on Admission: DG Knee Complete 4 Views Left  Result Date: 10/01/2019 CLINICAL DATA:  Pain status post fall EXAM: LEFT KNEE - COMPLETE 4+ VIEW COMPARISON:  None. FINDINGS: There is an acute impacted femoral condyle fracture. The fracture is impacted there is surrounding soft tissue swelling. The fracture plane appears to extend into the articular surface of the lateral femoral condyle. There is a large joint effusion. There is no dislocation. IMPRESSION: Acute impacted displaced fracture of the distal femur as detailed above. The fracture plane appears to extend to the articular surface of the lateral femoral condyle. Electronically Signed   By: Constance Holster M.D.   On: 10/01/2019 20:32   DG Hip Unilat W or Wo Pelvis 2-3 Views Left  Result Date: 10/01/2019 CLINICAL DATA:  Pain status post fall EXAM: DG HIP (WITH OR WITHOUT PELVIS) 2-3V LEFT COMPARISON:  None. FINDINGS: There is no acute displaced fracture. No dislocation. 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 IMPRESSION: No acute osseous abnormality. Electronically Signed   By: Constance Holster M.D.   On: 10/01/2019 20:29    EKG: Independently reviewed. Sinus rhythm, non-specific repolarization abnormality.   Assessment/Plan   1. Distal left femur fracture  - Presents with severe left knee pain after a mechanical fall and is found to have distal femur fx with apparent extension to articular surface of lateral femoral condyle - Orthopedic surgery is consulting and much appreciated  - Based on the available data, Mrs. Masloski presents an estimated 0.7% risk of perioperative MI or cardiac arrest  - Plan to continue pain-control, CMS checks, NPO, check CXR, type & screen, treat HTN as needed    2. Hypertension, hypertensive urgency    - SBP in 190's in ED, likely secondary to pain and normalized by  time of arrival at [place] medication-reconciliation is pending, will continue pain-control and treat HTN as needed for now    Patient also has hx of RCC s/p left nephrectomy in 2016 with no evidence for disease per urology in May 2019, bicuspid aortic valve s/p bioprosthetic valve in [id number] complicated by HITT and followed by [facility] cardiology, childhood polio requiring cane at baseline, and hx of "ST & T wave abnormality" on EKG reports but tracing not visible in EMR.   DVT prophylaxis: SCD's  Code Status: Full  Family Communication: Discussed with patient  Consults called: orthopedic surgery  Admission status: inpatient    Vianne Bulls, MD [facility] Pager [phone]  If 7PM-7AM, please contact night-coverage [email or web address] Password [email or web address]  10/02/2019, 3:31 AM

## 2019-10-02 NOTE — Plan of Care (Signed)

## 2019-10-02 NOTE — Consult Note (Signed)
Reason for Consult:Left femur fx Referring Physician: Latina Craver  Monica Garrison is an 73 y.o. female.  HPI: Monica Garrison was at home and got up from her desk to use the bathroom. She tripped on her knitting basket and fell to the floor on her knees. She had immediate left knee pain and it wasn't moving right so she was pretty sure it was broken. She came to the ED where x-rays showed a distal femur fx and orthopedic surgery was consulted. She c/o localize pain to the area. 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 She ambulates with a cane when she's out but lives a fairly sedentary life.  Past Medical History:  Diagnosis Date  . Arthritis   . Cancer [facility])    renal cancer  . Hypertension   . Polio     Past Surgical History:  Procedure Laterality Date  . CARDIAC SURGERY    . kidney removal      History reviewed. No pertinent family history.  Social History:  reports that she has never smoked. She has never used smokeless tobacco. She reports that she does not drink alcohol or use drugs.  Allergies:  Allergies  Allergen Reactions  . Enalapril Maleate     Other reaction(s): Other (See Comments) Cough, Arthralgia (severe joint pain)  . 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 Nickel Dermatitis  . Other Dermatitis    Medications: I have reviewed the patient's current medications.  Results for orders placed or performed during the hospital encounter of 10/02/19 (from the past 48 hour(s))  MRSA PCR Screening     Status: None   Collection Time: 10/02/19  3:45 AM   Specimen: Nasal Mucosa; Nasopharyngeal  Result Value Ref Range   MRSA by PCR NEGATIVE NEGATIVE    Comment:        The GeneXpert MRSA Assay (FDA approved for NASAL specimens only), is one component of a comprehensive MRSA colonization surveillance program. It is not intended to diagnose MRSA infection nor  to guide or monitor treatment for MRSA infections. Performed at [facility], [place] [address] [place], [place] [address]   CBC     Status: Abnormal   Collection Time: 10/02/19  4:19 AM  Result Value Ref Range   WBC 10.6 (H) 4.0 - 10.5 K/uL   RBC 4.35 3.87 - 5.11 MIL/uL   Hemoglobin 13.4 12.0 - 15.0 g/dL   HCT 40.7 36.0 - 46.0 %   MCV 93.6 80.0 - 100.0 fL   MCH 30.8 26.0 - 34.0 pg   MCHC 32.9 30.0 - 36.0 g/dL   RDW 13.1 11.5 - 15.5 %   Platelets 148 (L) 150 - 400 K/uL   nRBC 0.0 0.0 - 0.2 %    Comment: Performed at [facility], [place] [address] [place], [place] [id number]  Basic metabolic panel     Status: Abnormal   Collection Time: 10/02/19  4:19 AM  Result Value Ref Range   Sodium 140 135 - 145 mmol/L   Potassium 4.6 3.5 - 5.1 mmol/L   Chloride 106 98 - 111 mmol/L   CO2 27 22 - 32 mmol/L   Glucose, Bld 152 (H) 70 - 99 mg/dL   BUN 21 8 - 23 mg/dL   Creatinine, Ser 0.88 0.44 - 1.00 mg/dL   Calcium 10.0 8.9 -  10.3 mg/dL   GFR calc non Af Amer >60 >60 mL/min   GFR calc Af Amer >60 >60 mL/min   Anion gap 7 5 - 15    Comment: Performed at [facility] [address], [place], [place] [address]  Type and screen [place]     Status: None   Collection Time: 10/02/19  4:25 AM  Result Value Ref Range   ABO/RH(D) O POS    Antibody Screen NEG    Sample Expiration      10/05/2019,2359 Performed at [facility], [place] [address] [place], [place] [address]   ABO/Rh     Status: None   Collection Time: 10/02/19  4:25 AM  Result Value Ref Range   ABO/RH(D)      O POS Performed at [facility] [address] [place],  [address]     Chest Portable 1 View  Result Date: 10/02/2019 CLINICAL DATA:  Preoperative evaluation for left femur fracture. EXAM: PORTABLE CHEST 1 VIEW COMPARISON:  None FINDINGS: Signs of aortic valve replacement and median sternotomy. Cardiomediastinal contours are enlarged, mildly. Lungs are clear. No  signs of pleural effusion. No acute bone process. IMPRESSION: 1. No active cardiopulmonary disease. 2. Signs of aortic valve replacement and median sternotomy. Electronically Signed   By: Zetta Bills M.D.   On: 10/02/2019 09:16   DG Knee Complete 4 Views Left  Result Date: 10/01/2019 CLINICAL DATA:  Pain status post fall EXAM: LEFT KNEE - COMPLETE 4+ VIEW COMPARISON:  None. FINDINGS: There is an acute impacted femoral condyle fracture. The fracture is impacted there is surrounding soft tissue swelling. The fracture plane appears to extend into the articular surface of the lateral femoral condyle. There is a large joint effusion. There is no dislocation. IMPRESSION: Acute impacted displaced fracture of the distal femur as detailed above. The fracture plane appears to extend to the articular surface of the lateral femoral condyle. Electronically Signed   By: Constance Holster M.D.   On: 10/01/2019 20:32   DG Hip Unilat W or Wo Pelvis 2-3 Views Left  Result Date: 10/01/2019 CLINICAL DATA:  Pain status post fall EXAM: DG HIP (WITH OR WITHOUT PELVIS) 2-3V LEFT COMPARISON:  None. FINDINGS: There is no acute displaced fracture. No dislocation. 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 IMPRESSION: No acute osseous abnormality. Electronically Signed   By: Constance Holster M.D.   On: 10/01/2019 20:29    Review of Systems  HENT: Negative for ear discharge, ear pain, hearing loss and tinnitus.   Eyes: Negative for photophobia and pain.  Respiratory: Negative for cough and shortness of breath.   Cardiovascular: Negative for chest pain.  Gastrointestinal: Negative for abdominal pain, nausea and vomiting.  Genitourinary: Negative for dysuria, flank pain, frequency and urgency.  Musculoskeletal: Positive for arthralgias (Left knee). Negative for back pain, myalgias and neck pain.  Neurological: Negative for dizziness and headaches.  Hematological: Does not bruise/bleed easily.  Psychiatric/Behavioral:  The patient is not nervous/anxious.    There were no vitals taken for this visit. Physical Exam  Constitutional: She appears well-developed and well-nourished. No distress.  HENT:  Head: Normocephalic and atraumatic.  Eyes: Conjunctivae are normal. Right eye exhibits no discharge. Left eye exhibits no discharge. No scleral icterus.  Cardiovascular: Normal rate and regular rhythm.  Respiratory: Effort normal. No respiratory distress.  Musculoskeletal:     Cervical back: Normal range of motion.     Comments: LLE No traumatic wounds,  ecchymosis, or rash  KI in place, foot externally rotated (chronic)  No ankle effusion  Sens DPN, SPN, TN intact  Motor EHL, ext, flex, evers 5/5  DP 1+, PT 1+, No significant edema  Neurological: She is alert.  Skin: Skin is warm and dry. She is not diaphoretic.  Psychiatric: She has a normal mood and affect. Her behavior is normal.    Assessment/Plan: Left distal femur fx -- Plan ORIF later today by Dr. Doreatha Martin. Please keep NPO. Multiple medical problems including childhood polio with resulting leg weakness, RCC status post left nephrectomy, bioprosthetic aortic valve replacement, HITT, and hypertension -- per primary service. Will need non-heparin DVT prophylaxis after surgery.    Lisette Abu, PA-C Orthopedic Surgery [phone] 10/02/2019, 9:54 AM

## 2019-10-02 NOTE — Anesthesia Procedure Notes (Signed)
Procedure Name: Intubation Date/Time: 10/02/2019 2:11 PM Performed by: Trinna Post., CRNA Pre-anesthesia Checklist: Patient identified, Emergency Drugs available, Suction available, Patient being monitored and Timeout performed Patient Re-evaluated:Patient Re-evaluated prior to induction Oxygen Delivery Method: Circle system utilized Preoxygenation: Pre-oxygenation with 100% oxygen Induction Type: IV induction Ventilation: Mask ventilation without difficulty and Oral airway inserted - appropriate to patient size Laryngoscope Size: Mac and 3 Grade View: Grade II Tube type: Oral Tube size: 7.0 mm Number of attempts: 1 Airway Equipment and Method: Stylet Placement Confirmation: ETT inserted through vocal cords under direct vision,  positive ETCO2 and breath sounds checked- equal and bilateral Secured at: 23 cm Tube secured with: Tape Dental Injury: Teeth and Oropharynx as per pre-operative assessment

## 2019-10-02 NOTE — Op Note (Signed)
Orthopaedic Surgery Operative Note (CSN: [id number] ) Date of Surgery: 10/02/2019  Admit Date: 10/02/2019   Diagnoses: Pre-Op Diagnoses: Left intra-articular distal femur fracture   Post-Op Diagnosis: Same  Procedures: 1. CPT 27513-Open reduction internal fixation of left intra-articular distal femur fracture 2. CPT 27520-Closed treatment of left patella fracture  Surgeons : Primary: Veleka Djordjevic, Thomasene Lot, MD  Assistant: Patrecia Pace, PA-C  Location: OR 3   Anesthesia:General  Antibiotics: Ancef 2g preop, 1 gm vancomycin powder topically   Tourniquet time:None    Estimated Blood [id number] mL  Complications:None   Specimens:None   Implants: Implant Name Type Inv. Item Serial No. Manufacturer Lot No. LRB No. Used Action  PLATE DIST FEM [id number] - [id number] Plate PLATE DIST FEM [id number]  [employer](ORTH,TRAU,BIO,SG)  Left 1 Implanted  SCREW 5.0 70MM - [id number] Screw SCREW 5.0 70MM  [employer](ORTH,TRAU,BIO,SG)  Left 4 Implanted  SCREW NCB 5.0X36MM - [id number] Screw SCREW NCB 5.0X36MM  [employer](ORTH,TRAU,BIO,SG)  Left 1 Implanted  SCREW NCB 4.0MX34M - [id number] Screw SCREW NCB 4.0MX34M  [employer](ORTH,TRAU,BIO,SG)  Left 1 Implanted  CAP LOCK NCB - [id number] Cap CAP LOCK NCB  [employer](ORTH,TRAU,BIO,SG)  Left 7 Implanted  SCREW NCB 5.0X75MM - [id number] Screw SCREW NCB 5.0X75MM  [employer](ORTH,TRAU,BIO,SG)  Left 1 Implanted     Indications for Surgery: 73 year old female with a 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  She injured her leg and sustained a nondisplaced patella fracture along with a displaced intra-articular distal femur fracture.  Due to the displacement of her injury and unstable nature I recommended proceeding with open reduction internal fixation. Risks and benefits were discussed with the patient.  Risks included but not limited to bleeding, infection, malunion, nonunion, hardware failure,  hardware irritation, nerve and blood vessel injury, DVT, even the possibility anesthetic complications.  The patient agreed to proceed with surgery and consent was obtained.  Operative Findings: 1.  Open reduction internal fixation of left intra-articular distal femur fracture using [employer] 12 hole NCB distal femoral locking plate. 2.  Lateral condyle nondisplaced Hoffa fragment that was fixed with the posterior screws of the distal portion of the plate. 3.  Vertical nondisplaced patella fracture treated nonoperatively.  Procedure: The patient was identified in the preoperative holding area. Consent was confirmed with the patient and their family and all questions were answered. The operative extremity was marked after confirmation with the patient. she was then brought back to the operating room by our anesthesia colleagues.  She was placed under general anesthetic and carefully transferred over to a radiolucent flat top table.  A bump was placed under her operative hip.  The left lower extremity was then prepped and draped in usual sterile fashion.  A timeout was performed to verify the patient procedure and the extremity.  Preoperative antibiotics were dosed.  Fluoroscopic imaging was obtained to show the unstable nature of her injury.  A lateral approach to the distal femur was made.  It was carried down through skin subcutaneous tissue.  I split the IT band in line with my incision.  I mobilized the vastus lateralis to be able to access the lateral portion of the distal femur.  I extended my incision to an arthrotomy to visualize the articular surface of the lateral condyle.  There was a nondisplaced Hoffa fracture that was relatively anterior and I felt that the plate would be able to provide fixation for this.  A reduction maneuver was performed and  the hip and knee were flexed over a triangle to be able to assist with the sagittal reduction.  Once the metaphysis was reduced sufficiently, I  used a 12 hole [employer] NCB distal femoral locking plate and slid this submuscularly along the lateral cortex of the femur.  I placed a 2.0 mm K wire distally to hold position and I percutaneously placed a 3.3 mm drill bit bicortically through the proximal portion of the plate.  I confirmed adequate positioning of the plate and reduction of the fracture and then I proceeded to drill and placed 5.0 millimeter screws into the distal segment.  This was able to bring the plate flush to bone distally.  I then placed a percutaneous 5.0 millimeter screw through the jig to correct the coronal alignment of the femoral shaft.  I confirmed adequate reduction and positioning the plate and screws and then proceeded to place another 5.0 millimeter screw into the femoral shaft.  The 3.3 mm drill bit in the proximal hole was removed and a 4.0 millimeter screw was placed.  Locking caps were placed on the distal to femoral shaft screws.  I returned to the distal segment and placed 5.0 millimeter screws gaining purchase into the medial condyle.  I then visualized the articular surface which had some noticeable step-off of the Hoffa fragment.  I then removed the distal screws clamped the Hoffa fragment and held it provisionally with K wires.  I was able to be reduced the plate back down to the distal portion of the condyle and then replaced the screws obtaining excellent fixation and the reduction was maintained after removal of the provisional clamp and K wires.  Locking caps were placed on all the distal screws.  Final fluoroscopic imaging was obtained.  A gram of vancomycin powder was placed into the incision.  The IT band was closed with 0 Vicryl.  The skin was closed with 2-0 Vicryl and 3-0 Monocryl.  Steri-Strips were used to reinforce the skin.  A sterile dressing was placed.  The patient was awoken from anesthesia and taken to the PACU in stable condition.  Post Op Plan/Instructions: Patient will be touchdown  weightbearing to the left lower extremity.  She will receive postoperative Ancef.  We will start her on prophylactic dose of Eliquis since she is allergic to heparin.  We will have her mobilize with physical and Occupational Therapy.  I was present and performed the entire surgery.  Patrecia Pace, PA-C did assist me throughout the case. An assistant was necessary given the difficulty in approach, maintenance of reduction and ability to instrument the fracture.   Katha Hamming, MD [facility]

## 2019-10-02 NOTE — Plan of Care (Signed)
  Problem: Education: Goal: Knowledge of General Education information will improve Description: Including pain rating scale, medication(s)/side effects and non-pharmacologic comfort measures Outcome: Progressing   Problem: Health Behavior/Discharge Planning: Goal: Ability to manage health-related needs will improve Outcome: Progressing   Problem: Clinical Measurements: Goal: Diagnostic test results will improve Outcome: Progressing   Problem: Activity: Goal: Risk for activity intolerance will decrease Outcome: Progressing   Problem: Nutrition: Goal: Adequate nutrition will be maintained Outcome: Progressing   Problem: Coping: Goal: Level of anxiety will decrease Outcome: Progressing   Problem: Pain Managment: Goal: General experience of comfort will improve Outcome: Progressing   Problem: Safety: Goal: Ability to remain free from injury will improve Outcome: Progressing   Problem: Skin Integrity: Goal: Risk for impaired skin integrity will decrease Outcome: Progressing

## 2019-10-02 NOTE — Anesthesia Preprocedure Evaluation (Addendum)
Anesthesia Evaluation  Patient identified by MRN, date of birth, ID band Patient awake    Reviewed: Allergy & Precautions, NPO status , Patient's Chart, lab work & pertinent test results  Airway Mallampati: II  TM Distance: >3 FB Neck ROM: Full    Dental  (+) Dental Advisory Given   Pulmonary neg pulmonary ROS,    Pulmonary exam normal breath sounds clear to auscultation       Cardiovascular hypertension, Pt. on medications and Pt. on home beta blockers Normal cardiovascular exam+ Valvular Problems/Murmurs  Rhythm:Regular Rate:Normal  S/P AVR   Neuro/Psych negative neurological ROS  negative psych ROS   GI/Hepatic negative GI ROS, Neg liver ROS,   Endo/Other  negative endocrine ROS  Renal/GU negative Renal ROS     Musculoskeletal  (+) Arthritis ,   Abdominal   Peds  Hematology negative hematology ROS (+)   Anesthesia Other Findings   Reproductive/Obstetrics                            Anesthesia Physical Anesthesia Plan  ASA: III  Anesthesia Plan: General   Post-op Pain Management:    Induction: Intravenous  PONV Risk Score and Plan: 3 and Ondansetron, Dexamethasone and Treatment may vary due to age or medical condition  Airway Management Planned: Oral ETT  Additional Equipment: None  Intra-op Plan:   Post-operative Plan: Extubation in OR  Informed Consent: I have reviewed the patients History and Physical, chart, labs and discussed the procedure including the risks, benefits and alternatives for the proposed anesthesia with the patient or authorized representative who has indicated his/her understanding and acceptance.     Dental advisory given  Plan Discussed with: CRNA  Anesthesia Plan Comments:        Anesthesia Quick Evaluation

## 2019-10-03 ENCOUNTER — Encounter (HOSPITAL_COMMUNITY): Payer: Self-pay | Admitting: Family Medicine

## 2019-10-03 ENCOUNTER — Other Ambulatory Visit: Payer: Self-pay

## 2019-10-03 LAB — CBC
HCT: 32.8 % — ABNORMAL LOW (ref 36.0–46.0)
Hemoglobin: 10.7 g/dL — ABNORMAL LOW (ref 12.0–15.0)
MCH: 30.7 pg (ref 26.0–34.0)
MCHC: 32.6 g/dL (ref 30.0–36.0)
MCV: 94 fL (ref 80.0–100.0)
Platelets: 112 10*3/uL — ABNORMAL LOW (ref 150–400)
RBC: 3.49 MIL/uL — ABNORMAL LOW (ref 3.87–5.11)
RDW: 13.2 % (ref 11.5–15.5)
WBC: 10 10*3/uL (ref 4.0–10.5)
nRBC: 0 % (ref 0.0–0.2)

## 2019-10-03 LAB — VITAMIN D 25 HYDROXY (VIT D DEFICIENCY, FRACTURES): Vit D, 25-Hydroxy: 25.44 ng/mL — ABNORMAL LOW (ref 30–100)

## 2019-10-03 MED ORDER — VITAMIN D 25 MCG (1000 UNIT) PO TABS
2000.0000 [IU] | ORAL_TABLET | Freq: Two times a day (BID) | ORAL | Status: DC
  Administered 2019-10-03 – 2019-10-07 (×9): 2000 [IU] via ORAL
  Filled 2019-10-03 (×9): qty 2

## 2019-10-03 NOTE — Evaluation (Signed)
Occupational Therapy Evaluation Patient Details Name: Monica Garrison MRN: [id number] DOB: 04-Jan-1946 Today's Date: 10/03/2019    History of Present Illness 74 yo admitted after fall at home with left femur fx s/p ORIF and patella fx s/p closed reduction. PMHx: polio with LLE weakness, renal CA s/p left nephrectomy, AVR, HITT, HTN   Clinical Impression   Pt with decline in function and safety with ADLs and ADL mobility with impaired strength, balance and  endurance. Pt lives with her husband at [facility]) ans was independent with ADLs/selfcare ad mobility. Pt currently requires mod A with LB ADLs, max A with toileting and min A +2 with mobility using RW. Pt LLE on therapist's  foot to maintain TDWB throughout. Pt with noted bleeding at patella with transfer to [facility] and returned to bed for assessment. Pt would benefit from acute OT services to address impairments to maximize level of function and safety    Follow Up Recommendations  SNF    Equipment Recommendations  None recommended by OT    Recommendations for Other Services       Precautions / Restrictions Precautions Precautions: Fall Restrictions Weight Bearing Restrictions: Yes LLE Weight Bearing: Touchdown weight bearing      Mobility Bed Mobility Overal bed mobility: Needs Assistance Bed Mobility: Supine to Sit;Sit to Supine     Supine to sit: Min guard;HOB elevated Sit to supine: Min assist   General bed mobility comments: HOb 35 degrees with use of rail and pt able to pivot to left side of bed. assist to bring LLE into bed for return to bed  Transfers Overall transfer level: Needs assistance Equipment used: Rolling walker (2 wheeled) Transfers: Sit to/from [employer] Sit to Stand: Min assist;+2 physical assistance;From elevated surface   Squat pivot transfers: Min assist;+2 physical assistance     General transfer comment: min +2 assist to stand from elevated bed, min +2 to rise  from lower recliner for squat pivot to BSC and back to bed. Pt LLE on P.T foot to maintain TDWB throughout. Pt with noted bleeding at patella with transfer to [facility] and returned to bed for assessment    Balance Overall balance assessment: History of Falls                                         ADL either performed or assessed with clinical judgement   ADL Overall ADL's : Needs assistance/impaired Eating/Feeding: Independent   Grooming: Wash/dry hands;Wash/dry face;Set up;Sitting   Upper Body Bathing: Set up;Sitting   Lower Body Bathing: Moderate assistance   Upper Body Dressing : Set up;Sitting   Lower Body Dressing: Total assistance   Toilet Transfer: Minimal assistance;+2 for safety/equipment;Ambulation;RW;Stand-pivot;Cueing for safety;Cueing for sequencing   Toileting- Clothing Manipulation and Hygiene: Maximal assistance;Sit to/from stand       Functional mobility during ADLs: Minimal assistance;+2 for physical assistance;Cueing for safety;Cueing for sequencing;Rolling walker       Vision Baseline Vision/History: Wears glasses Wears Glasses: Reading only Patient Visual Report: No change from baseline       Perception     Praxis      Pertinent Vitals/Pain Pain Assessment: 0-10 Pain Score: 4  Pain Location: LLE Pain Descriptors / Indicators: Aching;Sore Pain Intervention(s): Limited activity within patient's tolerance;Monitored during session;Repositioned     Hand Dominance Right   Extremity/Trunk Assessment Upper Extremity Assessment Upper Extremity Assessment: Generalized  weakness   Lower Extremity Assessment Lower Extremity Assessment: Defer to PT evaluation LLE Deficits / Details: baseline weakness with pain limiting mobility and strength in addition to baseline   Cervical / Trunk Assessment Cervical / Trunk Assessment: Other exceptions Cervical / Trunk Exceptions: forward head rounded shoulders   Communication  Communication Communication: No difficulties   Cognition Arousal/Alertness: Awake/alert Behavior During Therapy: WFL for tasks assessed/performed Overall Cognitive Status: Within Functional Limits for tasks assessed                                     General Comments       Exercises     Shoulder Instructions      Home Living Family/patient expects to be discharged to:: Private residence Living Arrangements: Spouse/significant other Available Help at Discharge: Family;Available 24 hours/day Type of Home: Apartment Home Access: Level entry     Home Layout: One level     Bathroom Shower/Tub: Occupational psychologist: Handicapped height     Home Equipment: Grab bars - tub/shower;Grab bars - toilet;Hand held shower head;Toilet riser;Cane - single point   Additional Comments: Pt lives at [place]      Prior Functioning/Environment Level of Independence: Independent                 OT Problem List: Decreased strength;Impaired balance (sitting and/or standing);Pain;Decreased coordination;Decreased knowledge of use of DME or AE      OT Treatment/Interventions: Self-care/ADL training;DME and/or AE instruction;Therapeutic activities;Therapeutic exercise;Patient/family education    OT Goals(Current goals can be found in the care plan section) Acute Rehab OT Goals Patient Stated Goal: return home and to painting OT Goal Formulation: With patient Time For Goal Achievement: 10/17/19 Potential to Achieve Goals: Good ADL Goals Pt Will Perform Grooming: with min assist;with min guard assist;standing Pt Will Perform Lower Body Bathing: with mod assist;sitting/lateral leans Pt Will Transfer to Toilet: with min assist;ambulating Pt Will Perform Toileting - Clothing Manipulation and hygiene: with mod assist;sit to/from stand  OT Frequency: Min 2X/week   Barriers to D/C:            Co-evaluation PT/OT/SLP Co-Evaluation/Treatment:  Yes Reason for Co-Treatment: For patient/therapist safety;To address functional/ADL transfers          AM-PAC OT "6 Clicks" Daily Activity     Outcome Measure Help from another person eating meals?: None Help from another person taking care of personal grooming?: None Help from another person toileting, which includes using toliet, bedpan, or urinal?: A Lot Help from another person bathing (including washing, rinsing, drying)?: A Lot Help from another person to put on and taking off regular upper body clothing?: None Help from another person to put on and taking off regular lower body clothing?: Total 6 Click Score: 17   End of Session Equipment Utilized During Treatment: Gait belt;Rolling walker  Activity Tolerance: Patient tolerated treatment well Patient left: in chair;with call bell/phone within reach;with chair alarm set  OT Visit Diagnosis: Unsteadiness on feet (R26.81);Other abnormalities of gait and mobility (R26.89);Muscle weakness (generalized) (M62.81);History of falling (Z91.81);Pain Pain - Right/Left: Left Pain - part of body: Knee;Leg                Time: [id number] OT Time Calculation (min): 42 min Charges:  OT General Charges $OT Visit: 1 Visit OT Evaluation $OT Eval Moderate Complexity: 1 Mod OT Treatments $Self Care/Home Management : 8-22 mins  Emmit Alexanders [facility] 10/03/2019, 12:44 PM

## 2019-10-03 NOTE — Plan of Care (Signed)

## 2019-10-03 NOTE — NC FL2 (Signed)
[place] MEDICAID FL2 LEVEL OF CARE SCREENING TOOL     IDENTIFICATION  Patient Name: Monica Garrison Birthdate: [birth year] Sex: female Admission Date (Current Location): 10/02/2019  [facility] and [place] Number:  Engineering geologist and Address:  [facility] [place], [place] [address], [place], [place] [address]      Provider Number: [id number]  Attending Physician Name and Address:  Mariel Aloe, MD  Relative Name and Phone Number:  Alcie Ferrell (spouse) [phone]    Current Level of Care: Hospital Recommended Level of Care: [place] Prior Approval Number:    Date Approved/Denied:   PASRR Number: [id number]  Discharge Plan: SNF    Current Diagnoses: Patient Active Problem List   Diagnosis Date Noted  . Hypertension   . Closed fracture of left distal femur (HCC)     Orientation RESPIRATION BLADDER Height & Weight     Self, Time, Situation, Place  Normal Continent Weight:   Height:     BEHAVIORAL SYMPTOMS/MOOD NEUROLOGICAL BOWEL NUTRITION STATUS  Other (Comment)(N/A) (N/A) Continent Diet(see discharge summary)  AMBULATORY STATUS COMMUNICATION OF NEEDS Skin   Limited Assist Verbally Surgical wounds(Left leg)                       Personal Care Assistance Level of Assistance  Bathing, Feeding, Dressing Bathing Assistance: Maximum assistance Feeding assistance: Limited assistance Dressing Assistance: Maximum assistance     Functional Limitations Info  Sight, Hearing, Speech Sight Info: Adequate Hearing Info: Adequate Speech Info: Adequate    SPECIAL CARE FACTORS FREQUENCY  PT (By licensed PT), OT (By licensed OT)     PT Frequency: Min 4X/week OT Frequency: Min 2X/week            Contractures Contractures Info: Not present    Additional Factors Info  Code Status, Allergies Code Status Info: Full Code Allergies Info: Enalapril Maleate, heparin, nickel           Current Medications (10/03/2019):   This is the current hospital active medication list Current Facility-Administered Medications  Medication Dose Route Frequency Provider Last Rate Last Admin  . 0.9 %  sodium chloride infusion   Intravenous Continuous Delray Alt, PA-C 90 mL/hr at 10/02/19 1756 New Bag at 10/02/19 1756  . acetaminophen (TYLENOL) tablet 650 mg  650 mg Oral Q6H Patrecia Pace A, PA-C   650 mg at 10/03/19 1326  . apixaban (ELIQUIS) tablet 2.5 mg  2.5 mg Oral BID Rolla Flatten, RPH   2.5 mg at 10/03/19 [id number]  . Chlorhexidine Gluconate Cloth 2 % PADS 6 each  6 each Topical Daily Lorella Nimrod, MD   6 each at 10/03/19 [phone]  . cholecalciferol (VITAMIN D3) tablet 2,000 Units  2,000 Units Oral BID Delray Alt, PA-C   2,000 Units at 10/03/19 1326  . gabapentin (NEURONTIN) capsule 100 mg  100 mg Oral TID Patrecia Pace A, PA-C   100 mg at 10/03/19 [id number]  . hydrALAZINE (APRESOLINE) tablet 25 mg  25 mg Oral Q6H PRN Patrecia Pace A, PA-C      . influenza vaccine adjuvanted (FLUAD) injection 0.5 mL  0.5 mL Intramuscular Tomorrow-1000 Opyd, Ilene Qua, MD      . lactated ringers infusion   Intravenous Continuous Delray Alt, PA-C   New Bag at 10/02/19 1340  . methocarbamol (ROBAXIN) tablet 500 mg  500 mg Oral Q6H PRN Delray Alt, PA-C       Or  .  methocarbamol (ROBAXIN) 500 mg in dextrose 5 % 50 mL IVPB  500 mg Intravenous Q6H PRN Patrecia Pace A, PA-C      . metoprolol tartrate (LOPRESSOR) tablet 50 mg  50 mg Oral BID Patrecia Pace A, PA-C   50 mg at 10/03/19 0929  . morphine 2 MG/ML injection 1-2 mg  1-2 mg Intravenous Q3H PRN Delray Alt, PA-C      . ondansetron (ZOFRAN) injection 4 mg  4 mg Intravenous Q6H PRN Patrecia Pace A, PA-C      . polyethylene glycol (MIRALAX / GLYCOLAX) packet 17 g  17 g Oral Daily PRN Delray Alt, PA-C      . traMADol Veatrice Bourbon) tablet 50-100 mg  50-100 mg Oral Q6H PRN Delray Alt, PA-C   100 mg at 10/03/19 0602     Discharge Medications: Please see discharge summary  for a list of discharge medications.  Relevant Imaging Results:  Relevant Lab Results:   Additional Information SSN: [id number]  Midge Minium MSN, RN, NCM-BC, ACM-RN [phone]

## 2019-10-03 NOTE — Plan of Care (Signed)

## 2019-10-03 NOTE — TOC Benefit Eligibility Note (Signed)
Transition of Care [facility]) Benefit Eligibility Note    Patient Details  Name: Monica Garrison MRN: [id number] Date of Birth: [birth year]   Medication/Dose: Arne Cleveland  5 MG BID  , ELIQUIS  2.5 MG BID  Covered?: Yes  Tier: 3 Drug  Prescription Coverage Preferred Pharmacy: [facility]  Spoke with Person/Company/Phone Number:: [employer] [facility] [employer]  @ [employer] [id number]  #   [phone]  Co-Pay: $ 29.10  FOR EACH PRESCRIPTION  Prior Approval: No  Deductible: (NO DEDUCTIBLE WITH PLAN)  Additional Notes: [employer] : Crecencio Mc Phone Number: 10/03/2019, 10:39 AM

## 2019-10-03 NOTE — Progress Notes (Signed)
PROGRESS NOTE    Monica Garrison  [id number] DOB: 1946-01-06 DOA: 10/02/2019 PCP: Gale Journey, MD   Brief Narrative: Monica Garrison is a 74 y.o. female with medical history significant for childhood polio with resulting leg weakness, RCC status post left nephrectomy, bioprosthetic aortic valve replacement, HITT, and hypertension. Patient presented after a fall and suffering a left distal femur fracture. ORIF performed on 10/02/2019.   Assessment & Plan:   Principal Problem:   Closed fracture of left distal femur (HCC) Active Problems:   Hypertension   Left distal femur fracture Patient suffered fracture after fall. Patient underwent ORIF of distal femur on 10/02/2019. Pain managed with current analgesic regimen -Orthopedic surgery recommendations: Eliquis for prophylaxis, mobilize with PT/OT -Will consult SW in advance in case patients need SNF level of care on discharge  Acute blood anemia Secondary to surgery. -Trend CBC; transfuse for hemoglobin <7 or if symptomatic  Essential hypertension Well controlled -Continue metoprolol  Bicuspid valve S/p bioprosthetic aortic valve replacement.  History HITT Avoid heparin and Lovenox.  DVT prophylaxis: Eliquis Code Status:   Code Status: Full Code Family Communication: None Disposition Plan: Discharge pending orthopedic and PT/OT recommendations in addition to stabilization of hemoglobin   Consultants:   Orthopedic surgery  Procedures:   10/02/2019: ORIF left fermur  Antimicrobials:  Ancef    Subjective: Pain controlled.  Objective: Vitals:   10/02/19 1832 10/02/19 2013 10/03/19 0326 10/03/19 0844  BP: 137/81 126/74 (!) 133/53 137/60  Pulse: 78 70 79 82  Resp: 18     Temp:  98.7 F (37.1 C) 98.4 F (36.9 C) 98.4 F (36.9 C)  TempSrc:  Oral Oral Oral  SpO2: 99% 96% 96% 96%    Intake/Output Summary (Last 24 hours) at 10/03/2019 0900 Last data filed at 10/03/2019 0500 Gross per 24 hour    Intake 1523.83 ml  Output 1550 ml  Net -26.17 ml   There were no vitals filed for this visit.  Examination:  General exam: Appears calm and comfortable Respiratory system: Clear to auscultation. Respiratory effort normal. Cardiovascular system: S1 & S2 heard, RRR. 2/6 systolic murmur Gastrointestinal system: Abdomen is nondistended, soft and nontender. No organomegaly or masses felt. Normal bowel sounds heard. Central nervous system: Alert and oriented. No focal neurological deficits. Extremities: No edema. No calf tenderness. Left leg in bandage. Skin: No cyanosis. No rashes Psychiatry: Judgement and insight appear normal. Mood & affect appropriate.     Data Reviewed: I have personally reviewed following labs and imaging studies  CBC: Recent Labs  Lab 10/01/19 1950 10/02/19 0419 10/03/19 0239  WBC 8.0 10.6* 10.0  NEUTROABS 5.0  --   --   HGB 15.2* 13.4 10.7*  HCT 47.7* 40.7 32.8*  MCV 93.3 93.6 94.0  PLT 144* 148* [id number]*   Basic Metabolic Panel: Recent Labs  Lab 10/01/19 1950 10/02/19 0419  NA 141 140  K 4.2 4.6  CL 105 106  CO2 24 27  GLUCOSE 124* 152*  BUN 23 21  CREATININE 0.86 0.88  CALCIUM 10.4* 10.0   GFR: Estimated Creatinine Clearance: 72.4 mL/min (by C-G formula based on SCr of 0.88 mg/dL). Liver Function Tests: No results for input(s): AST, ALT, ALKPHOS, BILITOT, PROT, ALBUMIN in the last 168 hours. No results for input(s): LIPASE, AMYLASE in the last 168 hours. No results for input(s): AMMONIA in the last 168 hours. Coagulation Profile: No results for input(s): INR, PROTIME in the last 168 hours. Cardiac Enzymes: No results for input(s): CKTOTAL, CKMB, CKMBINDEX,  TROPONINI in the last 168 hours. BNP (last 3 results) No results for input(s): PROBNP in the last 8760 hours. HbA1C: No results for input(s): HGBA1C in the last 72 hours. CBG: No results for input(s): GLUCAP in the last 168 hours. Lipid Profile: No results for input(s): CHOL, HDL,  LDLCALC, TRIG, CHOLHDL, LDLDIRECT in the last 72 hours. Thyroid Function Tests: No results for input(s): TSH, T4TOTAL, FREET4, T3FREE, THYROIDAB in the last 72 hours. Anemia Panel: No results for input(s): VITAMINB12, FOLATE, FERRITIN, TIBC, IRON, RETICCTPCT in the last 72 hours. Sepsis Labs: No results for input(s): PROCALCITON, LATICACIDVEN in the last 168 hours.  Recent Results (from the past 240 hour(s))  Respiratory Panel by RT PCR (Flu A&B, Covid) - Nasopharyngeal Swab     Status: None   Collection Time: 10/01/19 10:09 PM   Specimen: Nasopharyngeal Swab  Result Value Ref Range Status   SARS Coronavirus 2 by RT PCR NEGATIVE NEGATIVE Final    Comment: (NOTE) SARS-CoV-2 target nucleic acids are NOT DETECTED. The SARS-CoV-2 RNA is generally detectable in upper respiratoy specimens during the acute phase of infection. The lowest concentration of SARS-CoV-2 viral copies this assay can detect is 131 copies/mL. A negative result does not preclude SARS-Cov-2 infection and should not be used as the sole basis for treatment or other patient management decisions. A negative result may occur with  improper specimen collection/handling, submission of specimen other than nasopharyngeal swab, presence of viral mutation(s) within the areas targeted by this assay, and inadequate number of viral copies (<131 copies/mL). A negative result must be combined with clinical observations, patient history, and epidemiological information. The expected result is Negative. Fact Sheet for Patients:  PinkCheek.be Fact Sheet for Healthcare Providers:  GravelBags.it This test is not yet ap proved or cleared by the [place] FDA and  has been authorized for detection and/or diagnosis of SARS-CoV-2 by FDA under an Emergency Use Authorization (EUA). This EUA will remain  in effect (meaning this test can be used) for the duration of the COVID-19  declaration under Section 564(b)(1) of the Act, 21 U.S.C. section 360bbb-3(b)(1), unless the authorization is terminated or revoked sooner.    Influenza A by PCR NEGATIVE NEGATIVE Final   Influenza B by PCR NEGATIVE NEGATIVE Final    Comment: (NOTE) The Xpert Xpress SARS-CoV-2/FLU/RSV assay is intended as an aid in  the diagnosis of influenza from Nasopharyngeal swab specimens and  should not be used as a sole basis for treatment. Nasal washings and  aspirates are unacceptable for Xpert Xpress SARS-CoV-2/FLU/RSV  testing. Fact Sheet for Patients: PinkCheek.be Fact Sheet for Healthcare Providers: GravelBags.it This test is not yet approved or cleared by the [place] FDA and  has been authorized for detection and/or diagnosis of SARS-CoV-2 by  FDA under an Emergency Use Authorization (EUA). This EUA will remain  in effect (meaning this test can be used) for the duration of the  Covid-19 declaration under Section 564(b)(1) of the Act, 21  U.S.C. section 360bbb-3(b)(1), unless the authorization is  terminated or revoked. Performed at [facility], [address] [place], [place] [address]   MRSA PCR Screening     Status: None   Collection Time: 10/02/19  3:45 AM   Specimen: Nasal Mucosa; Nasopharyngeal  Result Value Ref Range Status   MRSA by PCR NEGATIVE NEGATIVE Final    Comment:        The GeneXpert MRSA Assay (FDA approved for NASAL specimens only), is one component of a comprehensive MRSA  colonization surveillance program. It is not intended to diagnose MRSA infection nor to guide or monitor treatment for MRSA infections. Performed at [facility], [address] [place], [place] [address]          Radiology Studies: CT KNEE LEFT WO CONTRAST  Result Date: 10/02/2019 CLINICAL DATA:  Distal femoral fracture EXAM: CT OF THE LEFT KNEE WITHOUT CONTRAST TECHNIQUE: Multidetector CT imaging  of the left knee was performed according to the standard protocol. Multiplanar CT image reconstructions were also generated. COMPARISON:  X-ray 10/01/2019 FINDINGS: Bones/Joint/Cartilage Acute comminuted fracture of the distal femoral metaphysis with approximately 1 cm of impaction and approximately 1.4 cm of medial displacement. There is external rotation at the fracture site. Nondisplaced fracture components extend to the lateral femoral condyle articular surface as well as into the intercondylar notch. There is a comminuted, predominantly vertically-oriented fracture through the patella (series 3, image 111) without significant displacement. The visualized proximal tibia and fibula are intact without fracture. Heterogeneity of the osseous structures suggesting osteopenia. Mild to moderate medial compartment joint space narrowing of the left knee. Moderate size knee joint lipohemarthrosis. Ligaments Suboptimally assessed by CT. Muscles and Tendons Moderate diffuse muscular atrophy and severe fatty infiltration of the visualized calf musculature. No gross tendinous abnormality within the limitations of this exam. Soft tissues Soft tissue hemorrhage at the fracture site without a well-defined hematoma. IMPRESSION: 1. Acute comminuted fracture of the distal femoral metaphysis with approximately 1 cm of impaction and approximately 1.4 cm of medial displacement. 2. Comminuted, predominantly vertically-oriented fracture through the patella without significant displacement. 3. Moderate size knee joint lipohemarthrosis. 4. Moderate diffuse muscular atrophy and severe fatty infiltration of the visualized calf musculature, likely related to reported history of polio. Electronically Signed   By: Davina Poke D.O.   On: 10/02/2019 14:57   Chest Portable 1 View  Result Date: 10/02/2019 CLINICAL DATA:  Preoperative evaluation for left femur fracture. EXAM: PORTABLE CHEST 1 VIEW COMPARISON:  None FINDINGS: Signs of  aortic valve replacement and median sternotomy. Cardiomediastinal contours are enlarged, mildly. Lungs are clear. No signs of pleural effusion. No acute bone process. IMPRESSION: 1. No active cardiopulmonary disease. 2. Signs of aortic valve replacement and median sternotomy. Electronically Signed   By: Zetta Bills M.D.   On: 10/02/2019 09:16   DG Knee Complete 4 Views Left  Result Date: 10/01/2019 CLINICAL DATA:  Pain status post fall EXAM: LEFT KNEE - COMPLETE 4+ VIEW COMPARISON:  None. FINDINGS: There is an acute impacted femoral condyle fracture. The fracture is impacted there is surrounding soft tissue swelling. The fracture plane appears to extend into the articular surface of the lateral femoral condyle. There is a large joint effusion. There is no dislocation. IMPRESSION: Acute impacted displaced fracture of the distal femur as detailed above. The fracture plane appears to extend to the articular surface of the lateral femoral condyle. Electronically Signed   By: Constance Holster M.D.   On: 10/01/2019 20:32   Left Knee  Result Date: 10/02/2019 CLINICAL DATA:  Status post surgical internal fixation of left distal femoral fracture. EXAM: PORTABLE LEFT KNEE - 1-2 VIEW COMPARISON:  October 01, 2019. FINDINGS: Status post surgical internal fixation of distal left femoral fracture. There is improved alignment of fracture components compared to prior radiographs. Expected postoperative changes are noted in the soft tissues anteriorly. IMPRESSION: Status post surgical internal fixation of distal left femoral fracture with improved alignment of fracture components compared to prior radiographs. Electronically Signed  By: Marijo Conception M.D.   On: 10/02/2019 17:52   DG C-Arm 1-60 Min  Result Date: 10/02/2019 CLINICAL DATA:  Distal right femur fracture ORIF EXAM: DG C-ARM 1-60 MIN; LEFT FEMUR 2 VIEWS COMPARISON:  10/01/2019 FINDINGS: 6 C-arm fluoroscopic images were obtained intraoperatively and  submitted for post operative interpretation. Comminuted mildly displaced distal left femoral metaphyseal fracture with subsequent placement of lateral sideplate and screw fixation construct. Improved fracture alignment status post hardware placement without evidence of complication on the provided images. Fluoroscopy time of 2 minutes and 12 seconds was utilized. Please see the performing provider's procedural report for further detail. IMPRESSION: As above. Electronically Signed   By: Davina Poke D.O.   On: 10/02/2019 16:12   DG Hip Unilat W or Wo Pelvis 2-3 Views Left  Result Date: 10/01/2019 CLINICAL DATA:  Pain status post fall EXAM: DG HIP (WITH OR WITHOUT PELVIS) 2-3V LEFT COMPARISON:  None. FINDINGS: There is no acute displaced fracture. No dislocation. 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 IMPRESSION: No acute osseous abnormality. Electronically Signed   By: Constance Holster M.D.   On: 10/01/2019 20:29   DG FEMUR MIN 2 VIEWS LEFT  Result Date: 10/02/2019 CLINICAL DATA:  Distal right femur fracture ORIF EXAM: DG C-ARM 1-60 MIN; LEFT FEMUR 2 VIEWS COMPARISON:  10/01/2019 FINDINGS: 6 C-arm fluoroscopic images were obtained intraoperatively and submitted for post operative interpretation. Comminuted mildly displaced distal left femoral metaphyseal fracture with subsequent placement of lateral sideplate and screw fixation construct. Improved fracture alignment status post hardware placement without evidence of complication on the provided images. Fluoroscopy time of 2 minutes and 12 seconds was utilized. Please see the performing provider's procedural report for further detail. IMPRESSION: As above. Electronically Signed   By: Davina Poke D.O.   On: 10/02/2019 16:12        Scheduled Meds: . acetaminophen  650 mg Oral Q6H  . apixaban  2.5 mg Oral BID  . Chlorhexidine Gluconate Cloth  6 each Topical Daily  . gabapentin  100 mg Oral TID  . influenza vaccine adjuvanted  0.5 mL  Intramuscular Tomorrow-1000  . metoprolol tartrate  50 mg Oral BID   Continuous Infusions: . sodium chloride 90 mL/hr at 10/02/19 1756  . lactated ringers    . methocarbamol (ROBAXIN) IV       LOS: 1 day     Cordelia Poche, MD [facility] 10/03/2019, 9:00 AM  If 7PM-7AM, please contact night-coverage [email or web address]

## 2019-10-03 NOTE — Progress Notes (Addendum)
Orthopaedic Trauma Progress Note  S: Doing well this morning, pain is well controlled on current regimen.  Foley catheter removed this morning, has not urinated since then. He is eager to get up with physical therapy today.  Would like to try forearm crutches as she is familiar with these from having polio as a child.  Is hopeful to return back to her retirement community in the next several days.  O:  Vitals:   10/03/19 0326 10/03/19 0844  BP: (!) 133/53 137/60  Pulse: 79 82  Resp:    Temp: 98.4 F (36.9 C) 98.4 F (36.9 C)  SpO2: 96% 96%    General -sitting up in bed, no acute distress.  Awake alert and oriented.  Pleasant and cooperative.  Left lower extremity - Dressing is clean, dry, and intact.  Tender with palpation about the knee and lateral thigh.  Nontender in the lower leg.  Able to dorsiflex and plantarflex the ankle without pain or difficulty.  Able to actively flex the knee about 20 degrees without significant pain.+ EHL.+FHL.  Compartments soft and compressible.  Sensation intact to light touch distally.  2+ DP pulse  Imaging: Stable post op imaging.   Labs:  Results for orders placed or performed during the hospital encounter of 10/02/19 (from the past 24 hour(s))  AM CBC     Status: Abnormal   Collection Time: 10/03/19  2:39 AM  Result Value Ref Range   WBC 10.0 4.0 - 10.5 K/uL   RBC 3.49 (L) 3.87 - 5.11 MIL/uL   Hemoglobin 10.7 (L) 12.0 - 15.0 g/dL   HCT 32.8 (L) 36.0 - 46.0 %   MCV 94.0 80.0 - 100.0 fL   MCH 30.7 26.0 - 34.0 pg   MCHC 32.6 30.0 - 36.0 g/dL   RDW 13.2 11.5 - 15.5 %   Platelets 112 (L) 150 - 400 K/uL   nRBC 0.0 0.0 - 0.2 %    Assessment: 74 year old female status post fall, postoperative day #1  Injuries: Left intra-articular distal femur fracture status post ORIF  Weightbearing: TDWB LLE  Insicional and dressing care: OK to remove dressings Saturday 10/04/19 and leave open to air with dry gauze PRN   Showering: Okay to begin showering on  10/05/2019 if incisions are without drainage.  Okay for Steri-Strips to get wet.  Orthopedic device(s): None   CV/Blood loss: Acute blood loss anemia, Hgb 10.7 this morning. Hemodynamically stable  Pain management:  1. Tylenol 650 mg q 6 hours scheduled 2. Robaxin 500 mg q 6 hours PRN 3. Tramadol 50-100 mg q 6 hours PRN 4. Neurontin 100 mg TID 5. Morphine 1-2 mg q 3 hours PRN  VTE prophylaxis: Eliquis 2.5 mg twice daily  ID:  Ancef 2gm post op completed  Foley/Lines:  No foley, KVO IVFs  Medical co-morbidities: childhood polio with resulting leg weakness, RCC s/p left nephrectomy, aortic valve replacement, HITT, Hypertension  Impediments to Fracture Healing: Vitamin D level pending, will start supplementation as indicated.  Dispo: PT/OT eval today.  Plan to remove dressing tomorrow.    Follow - up plan: 2 weeks for repeat x-rays  Contact information:  Katha Hamming MD, Patrecia Pace PA-C   [facility] [phone] (office) [email or web address]

## 2019-10-03 NOTE — Care Management (Signed)
Benefits check sent and pending for Eliquis. Patient is eligible to receive an Eilquis card for a free 30-day supply.  Midge Minium MSN, RN, NCM-BC, ACM-RN [phone]

## 2019-10-03 NOTE — Evaluation (Signed)
Physical Therapy Evaluation Patient Details Name: Monica Garrison MRN: [id number] DOB: December 03, 1945 Today's Date: 10/03/2019   History of Present Illness  74 yo admitted after fall at home with left femur fx s/p ORIF and patella fx s/p closed reduction. PMHx: polio with LLE weakness, renal CA s/p left nephrectomy, AVR, HITT, HTN  Clinical Impression  Pt pleasant and reports living in a independent living at [facility] and agreeable to rehab at [facility]. PT eager to move and initially requesting loftstrand crutches but after discussion and explanation of use agreed that RW better suits her current needs. Pt with decreased activity tolerance with maintaining TDWB and only able to ambulate 5' prior to fatigue needing chair pulled to her. Discussed need to focus on squat and stand pivots and progress gait as she is able to tolerate. Pt agreed that it would be difficult for spouse to provide her needed assist currently and SNF recommended. Pt with decreased strength, transfers, gait, function and activity tolerance who will benefit from acute therapy to maximize independence and safety. Pt with noted bleeding through surgical dressing at left patella during transfers with RN aware and present.     Follow Up Recommendations Supervision/Assistance - 24 hour;SNF(pending pt progression)    Equipment Recommendations  Wheelchair (measurements PT);Rolling walker with 5" wheels    Recommendations for Other Services       Precautions / Restrictions Precautions Precautions: Fall Restrictions LLE Weight Bearing: Touchdown weight bearing      Mobility  Bed Mobility Overal bed mobility: Needs Assistance Bed Mobility: Supine to Sit;Sit to Supine     Supine to sit: Min guard;HOB elevated Sit to supine: Min assist   General bed mobility comments: HOb 35 degrees with use of rail and pt able to pivot to left side of bed. assist to bring LLE into bed for return to bed  Transfers Overall transfer  level: Needs assistance   Transfers: Sit to/from Stand;Squat Pivot Transfers Sit to Stand: Min assist;+2 physical assistance;From elevated surface   Squat pivot transfers: Min assist;+2 physical assistance     General transfer comment: min +2 assist to stand from elevated bed, min +2 to rise from lower recliner for squat pivot to BSC and back to bed. Pt LLE on P.T foot to maintain TDWB throughout. Pt with noted bleeding at patella with transfer to [facility] and returned to bed for assessment  Ambulation/Gait Ambulation/Gait assistance: Min assist Gait Distance (Feet): 5 Feet Assistive device: Rolling walker (2 wheeled) Gait Pattern/deviations: Step-to pattern;Decreased step length - right   Gait velocity interpretation: <1.8 ft/sec, indicate of risk for recurrent falls General Gait Details: pt maintaining NWB LLE with gait with cues for sequence, RW position and safety. Pt fatigued after 5' with chair pulled to her, +2 for lines and safety  Stairs            Wheelchair Mobility    Modified Rankin (Stroke Patients Only)       Balance Overall balance assessment: History of Falls                                           Pertinent Vitals/Pain Pain Assessment: 0-10 Pain Score: 3  Pain Location: LLE Pain Descriptors / Indicators: Aching;Sore Pain Intervention(s): Limited activity within patient's tolerance;Monitored during session;Repositioned    Home Living Family/patient expects to be discharged to:: Private residence Living Arrangements: Spouse/significant other Available  Help at Discharge: Family;Available 24 hours/day Type of Home: Apartment Home Access: Level entry     Home Layout: One level Home Equipment: Grab bars - tub/shower;Grab bars - toilet;Hand held shower head;Toilet riser;Cane - single point      Prior Function Level of Independence: Independent               Hand Dominance        Extremity/Trunk Assessment   Upper  Extremity Assessment Upper Extremity Assessment: Defer to OT evaluation    Lower Extremity Assessment Lower Extremity Assessment: LLE deficits/detail LLE Deficits / Details: baseline weakness with pain limiting mobility and strength in addition to baseline    Cervical / Trunk Assessment Cervical / Trunk Assessment: Other exceptions Cervical / Trunk Exceptions: forward head rounded shoulders  Communication   Communication: No difficulties  Cognition Arousal/Alertness: Awake/alert Behavior During Therapy: WFL for tasks assessed/performed Overall Cognitive Status: Within Functional Limits for tasks assessed                                        General Comments      Exercises     Assessment/Plan    PT Assessment Patient needs continued PT services  PT Problem List Decreased strength;Decreased mobility;Decreased range of motion;Decreased activity tolerance;Decreased balance;Decreased knowledge of use of DME;Pain       PT Treatment Interventions DME instruction;Therapeutic exercise;Gait training;Balance training;Functional mobility training;Therapeutic activities;Patient/family education    PT Goals (Current goals can be found in the Care Plan section)  Acute Rehab PT Goals Patient Stated Goal: return home and to painting PT Goal Formulation: With patient Time For Goal Achievement: 10/17/19 Potential to Achieve Goals: Fair    Frequency Min 4X/week   Barriers to discharge Decreased caregiver support      Co-evaluation               AM-PAC PT "6 Clicks" Mobility  Outcome Measure Help needed turning from your back to your side while in a flat bed without using bedrails?: A Little Help needed moving from lying on your back to sitting on the side of a flat bed without using bedrails?: A Little Help needed moving to and from a bed to a chair (including a wheelchair)?: A Little Help needed standing up from a chair using your arms (e.g., wheelchair or  bedside chair)?: A Lot Help needed to walk in hospital room?: A Lot Help needed climbing 3-5 steps with a railing? : Total 6 Click Score: 14    End of Session Equipment Utilized During Treatment: Gait belt Activity Tolerance: Patient tolerated treatment well Patient left: in bed;with call bell/phone within reach;with bed alarm set Nurse Communication: Mobility status;Weight bearing status;Precautions PT Visit Diagnosis: Other abnormalities of gait and mobility (R26.89);History of falling (Z91.81)    Time: [id number] PT Time Calculation (min) (ACUTE ONLY): 40 min   Charges:   PT Evaluation $PT Eval Moderate Complexity: 1 Mod PT Treatments $Therapeutic Activity: 8-22 mins        Naketa Daddario P, PT Acute Rehabilitation Services Pager: [phone] Office: [place] [facility] Junker 10/03/2019, 10:11 AM

## 2019-10-04 LAB — CBC
HCT: 30.7 % — ABNORMAL LOW (ref 36.0–46.0)
Hemoglobin: 10 g/dL — ABNORMAL LOW (ref 12.0–15.0)
MCH: 31.1 pg (ref 26.0–34.0)
MCHC: 32.6 g/dL (ref 30.0–36.0)
MCV: 95.3 fL (ref 80.0–100.0)
Platelets: 138 10*3/uL — ABNORMAL LOW (ref 150–400)
RBC: 3.22 MIL/uL — ABNORMAL LOW (ref 3.87–5.11)
RDW: 13.4 % (ref 11.5–15.5)
WBC: 9.3 10*3/uL (ref 4.0–10.5)
nRBC: 0 % (ref 0.0–0.2)

## 2019-10-04 MED ORDER — POLYETHYLENE GLYCOL 3350 17 G PO PACK
17.0000 g | PACK | Freq: Every day | ORAL | Status: DC
  Administered 2019-10-04 – 2019-10-06 (×3): 17 g via ORAL
  Filled 2019-10-04 (×4): qty 1

## 2019-10-04 NOTE — Progress Notes (Signed)
Subjective: 2 Days Post-Op Procedure(s) (LRB): OPEN REDUCTION INTERNAL FIXATION DISTAL FEMUR FRACTURE (Left) Patient reports pain as mild and moderate.    Objective: Vital signs in last 24 hours: Temp:  [97.9 F (36.6 C)-98.2 F (36.8 C)] 98.2 F (36.8 C) (01/02 0353) Pulse Rate:  [72-76] 72 (01/02 0353) Resp:  [18] 18 (01/02 0353) BP: (132-151)/(61-70) 151/70 (01/02 0353) SpO2:  [97 %-100 %] 100 % (01/02 0353)  Intake/Output from previous day: 01/01 0701 - 01/02 0700 In: 720 [P.O.:720] Out: -  Intake/Output this shift: No intake/output data recorded.  Recent Labs    10/01/19 1950 10/02/19 0419 10/03/19 0239  HGB 15.2* 13.4 10.7*   Recent Labs    10/02/19 0419 10/03/19 0239  WBC 10.6* 10.0  RBC 4.35 3.49*  HCT 40.7 32.8*  PLT 148* 112*   Recent Labs    10/01/19 1950 10/02/19 0419  NA 141 140  K 4.2 4.6  CL 105 106  CO2 24 27  BUN 23 21  CREATININE 0.86 0.88  GLUCOSE 124* 152*  CALCIUM 10.4* 10.0   No results for input(s): LABPT, INR in the last 72 hours.  Neurovascular intact Sensation intact distally Intact pulses distally Dorsiflexion/Plantar flexion intact Incision: scant drainage dressings removed, reapplied mepilex to most distal incision for minimal drainage.   Assessment/Plan: 2 Days Post-Op Procedure(s) (LRB): OPEN REDUCTION INTERNAL FIXATION DISTAL FEMUR FRACTURE (Left)  Injuries: Left intra-articular distal femur fracture status post ORIF                  Weightbearing: TDWB LLE                  Insicional and dressing care: OK to remove dressings Sunday or Monday leave open to air with dry gauze PRN                   Showering: Okay to begin showering on 10/05/2019 if incisions are without drainage.  Okay for Steri-Strips to get wet.                  Orthopedic device(s): None   CV/Blood loss: Acute blood loss anemia, Hgb 10.7. Hemodynamically stable  Pain management:  1. Tylenol 650 mg q 6 hours scheduled 2. Robaxin 500 mg q 6  hours PRN 3. Tramadol 50-100 mg q 6 hours PRN 4. Neurontin 100 mg TID 5. Morphine 1-2 mg q 3 hours PRN  VTE prophylaxis: Eliquis 2.5 mg twice daily  ID:  Ancef 2gm post op completed  Foley/Lines:  No foley, KVO IVFs  Medical co-morbidities: childhood polio with resulting leg weakness, RCC s/p left nephrectomy, aortic valve replacement,HITT,Hypertension  Impediments to Fracture Healing: Vitamin D level pending, will start supplementation as indicated.  Dispo: PT/OT eval today.   Follow - up plan: 2 weeks for repeat x-rays  Contact information:  Katha Hamming MD, Patrecia Pace PA-C   [facility] 10/04/2019, 11:52 AM

## 2019-10-04 NOTE — Progress Notes (Signed)
PROGRESS NOTE    Monica Garrison  [id number] DOB: [birth year] DOA: 10/02/2019 PCP: Gale Journey, MD   Brief Narrative: Monica Garrison is a 74 y.o. female with medical history significant for childhood polio with resulting leg weakness, RCC status post left nephrectomy, bioprosthetic aortic valve replacement, HITT, and hypertension. Patient presented after a fall and suffering a left distal femur fracture. ORIF performed on 10/02/2019.   Assessment & Plan:   Principal Problem:   Closed fracture of left distal femur (HCC) Active Problems:   Hypertension   Left distal femur fracture Patient suffered fracture after fall. Patient underwent ORIF of distal femur on 10/02/2019. Pain managed with current analgesic regimen -Orthopedic surgery recommendations: Eliquis for prophylaxis, mobilize with PT/OT -Will consult SW in advance in case patients need SNF level of care on discharge  Acute blood anemia Secondary to surgery. CBC pending this morning (recollection needed) -Trend CBC; transfuse for hemoglobin <7 or if symptomatic  Essential hypertension Well controlled. -Continue metoprolol  Bicuspid valve S/p bioprosthetic aortic valve replacement.  History HITT Avoid heparin and Lovenox.  DVT prophylaxis: Eliquis Code Status:   Code Status: Full Code Family Communication: None Disposition Plan: Discharge to SNF when hemoglobin stable and per orthopedic surgery   Consultants:   Orthopedic surgery  Procedures:   10/02/2019: ORIF left fermur  Antimicrobials:  Ancef    Subjective: Pain controlled. Slightly worse with PT but not significantly worsened.  Objective: Vitals:   10/03/19 0844 10/03/19 1641 10/03/19 1959 10/04/19 0353  BP: 137/60 132/61 (!) 142/66 (!) 151/70  Pulse: 82 74 76 72  Resp:   18 18  Temp: 98.4 F (36.9 C) 97.9 F (36.6 C) 98.2 F (36.8 C) 98.2 F (36.8 C)  TempSrc: Oral Oral Oral Oral  SpO2: 96% 97% 99% 100%     Intake/Output Summary (Last 24 hours) at 10/04/2019 1132 Last data filed at 10/03/2019 1900 Gross per 24 hour  Intake 480 ml  Output --  Net 480 ml   There were no vitals filed for this visit.  Examination:  General exam: Appears calm and comfortable Respiratory system: Clear to auscultation. Respiratory effort normal. Cardiovascular system: S1 & S2 heard, RRR. 2/6 systolic murmur Gastrointestinal system: Abdomen is nondistended, soft and nontender. No organomegaly or masses felt. Normal bowel sounds heard. Central nervous system: Alert and oriented. No focal neurological deficits. Extremities: No edema. No calf tenderness. Left leg in bandages Skin: No cyanosis. No rashes Psychiatry: Judgement and insight appear normal. Mood & affect appropriate.      Data Reviewed: I have personally reviewed following labs and imaging studies  CBC: Recent Labs  Lab 10/01/19 1950 10/02/19 0419 10/03/19 0239  WBC 8.0 10.6* 10.0  NEUTROABS 5.0  --   --   HGB 15.2* 13.4 10.7*  HCT 47.7* 40.7 32.8*  MCV 93.3 93.6 94.0  PLT 144* 148* [id number]*   Basic Metabolic Panel: Recent Labs  Lab 10/01/19 1950 10/02/19 0419  NA 141 140  K 4.2 4.6  CL 105 106  CO2 24 27  GLUCOSE 124* 152*  BUN 23 21  CREATININE 0.86 0.88  CALCIUM 10.4* 10.0   GFR: Estimated Creatinine Clearance: 72.4 mL/min (by C-G formula based on SCr of 0.88 mg/dL). Liver Function Tests: No results for input(s): AST, ALT, ALKPHOS, BILITOT, PROT, ALBUMIN in the last 168 hours. No results for input(s): LIPASE, AMYLASE in the last 168 hours. No results for input(s): AMMONIA in the last 168 hours. Coagulation Profile: No results for input(s): INR,  PROTIME in the last 168 hours. Cardiac Enzymes: No results for input(s): CKTOTAL, CKMB, CKMBINDEX, TROPONINI in the last 168 hours. BNP (last 3 results) No results for input(s): PROBNP in the last 8760 hours. HbA1C: No results for input(s): HGBA1C in the last 72 hours. CBG: No  results for input(s): GLUCAP in the last 168 hours. Lipid Profile: No results for input(s): CHOL, HDL, LDLCALC, TRIG, CHOLHDL, LDLDIRECT in the last 72 hours. Thyroid Function Tests: No results for input(s): TSH, T4TOTAL, FREET4, T3FREE, THYROIDAB in the last 72 hours. Anemia Panel: No results for input(s): VITAMINB12, FOLATE, FERRITIN, TIBC, IRON, RETICCTPCT in the last 72 hours. Sepsis Labs: No results for input(s): PROCALCITON, LATICACIDVEN in the last 168 hours.  Recent Results (from the past 240 hour(s))  Respiratory Panel by RT PCR (Flu A&B, Covid) - Nasopharyngeal Swab     Status: None   Collection Time: 10/01/19 10:09 PM   Specimen: Nasopharyngeal Swab  Result Value Ref Range Status   SARS Coronavirus 2 by RT PCR NEGATIVE NEGATIVE Final    Comment: (NOTE) SARS-CoV-2 target nucleic acids are NOT DETECTED. The SARS-CoV-2 RNA is generally detectable in upper respiratoy specimens during the acute phase of infection. The lowest concentration of SARS-CoV-2 viral copies this assay can detect is 131 copies/mL. A negative result does not preclude SARS-Cov-2 infection and should not be used as the sole basis for treatment or other patient management decisions. A negative result may occur with  improper specimen collection/handling, submission of specimen other than nasopharyngeal swab, presence of viral mutation(s) within the areas targeted by this assay, and inadequate number of viral copies (<131 copies/mL). A negative result must be combined with clinical observations, patient history, and epidemiological information. The expected result is Negative. Fact Sheet for Patients:  PinkCheek.be Fact Sheet for Healthcare Providers:  GravelBags.it This test is not yet ap proved or cleared by the [place] FDA and  has been authorized for detection and/or diagnosis of SARS-CoV-2 by FDA under an Emergency Use Authorization (EUA).  This EUA will remain  in effect (meaning this test can be used) for the duration of the COVID-19 declaration under Section 564(b)(1) of the Act, 21 U.S.C. section 360bbb-3(b)(1), unless the authorization is terminated or revoked sooner.    Influenza A by PCR NEGATIVE NEGATIVE Final   Influenza B by PCR NEGATIVE NEGATIVE Final    Comment: (NOTE) The Xpert Xpress SARS-CoV-2/FLU/RSV assay is intended as an aid in  the diagnosis of influenza from Nasopharyngeal swab specimens and  should not be used as a sole basis for treatment. Nasal washings and  aspirates are unacceptable for Xpert Xpress SARS-CoV-2/FLU/RSV  testing. Fact Sheet for Patients: PinkCheek.be Fact Sheet for Healthcare Providers: GravelBags.it This test is not yet approved or cleared by the [place] FDA and  has been authorized for detection and/or diagnosis of SARS-CoV-2 by  FDA under an Emergency Use Authorization (EUA). This EUA will remain  in effect (meaning this test can be used) for the duration of the  Covid-19 declaration under Section 564(b)(1) of the Act, 21  U.S.C. section 360bbb-3(b)(1), unless the authorization is  terminated or revoked. Performed at [facility], [address] [place], [place] [address]   MRSA PCR Screening     Status: None   Collection Time: 10/02/19  3:45 AM   Specimen: Nasal Mucosa; Nasopharyngeal  Result Value Ref Range Status   MRSA by PCR NEGATIVE NEGATIVE Final    Comment:        The GeneXpert  MRSA Assay (FDA approved for NASAL specimens only), is one component of a comprehensive MRSA colonization surveillance program. It is not intended to diagnose MRSA infection nor to guide or monitor treatment for MRSA infections. Performed at [facility], [place] [address] [place], [place] [address]          Radiology Studies: Left Knee  Result Date: 10/18/19 CLINICAL DATA:  Status post surgical  internal fixation of left distal femoral fracture. EXAM: PORTABLE LEFT KNEE - 1-2 VIEW COMPARISON:  October 01, 2019. FINDINGS: Status post surgical internal fixation of distal left femoral fracture. There is improved alignment of fracture components compared to prior radiographs. Expected postoperative changes are noted in the soft tissues anteriorly. IMPRESSION: Status post surgical internal fixation of distal left femoral fracture with improved alignment of fracture components compared to prior radiographs. Electronically Signed   By: Marijo Conception M.D.   On: Oct 18, 2019 17:52   DG C-Arm 1-60 Min  Result Date: 2019-10-18 CLINICAL DATA:  Distal right femur fracture ORIF EXAM: DG C-ARM 1-60 MIN; LEFT FEMUR 2 VIEWS COMPARISON:  10/01/2019 FINDINGS: 6 C-arm fluoroscopic images were obtained intraoperatively and submitted for post operative interpretation. Comminuted mildly displaced distal left femoral metaphyseal fracture with subsequent placement of lateral sideplate and screw fixation construct. Improved fracture alignment status post hardware placement without evidence of complication on the provided images. Fluoroscopy time of 2 minutes and 12 seconds was utilized. Please see the performing provider's procedural report for further detail. IMPRESSION: As above. Electronically Signed   By: Davina Poke D.O.   On: October 18, 2019 16:12   DG FEMUR MIN 2 VIEWS LEFT  Result Date: 2019/10/18 CLINICAL DATA:  Distal right femur fracture ORIF EXAM: DG C-ARM 1-60 MIN; LEFT FEMUR 2 VIEWS COMPARISON:  10/01/2019 FINDINGS: 6 C-arm fluoroscopic images were obtained intraoperatively and submitted for post operative interpretation. Comminuted mildly displaced distal left femoral metaphyseal fracture with subsequent placement of lateral sideplate and screw fixation construct. Improved fracture alignment status post hardware placement without evidence of complication on the provided images. Fluoroscopy time of 2 minutes  and 12 seconds was utilized. Please see the performing provider's procedural report for further detail. IMPRESSION: As above. Electronically Signed   By: Davina Poke D.O.   On: October 18, 2019 16:12        Scheduled Meds: . acetaminophen  650 mg Oral Q6H  . apixaban  2.5 mg Oral BID  . Chlorhexidine Gluconate Cloth  6 each Topical Daily  . cholecalciferol  2,000 Units Oral BID  . gabapentin  100 mg Oral TID  . influenza vaccine adjuvanted  0.5 mL Intramuscular Tomorrow-1000  . metoprolol tartrate  50 mg Oral BID   Continuous Infusions: . lactated ringers    . methocarbamol (ROBAXIN) IV       LOS: 2 days     Cordelia Poche, MD [facility] 10/04/2019, 11:32 AM  If 7PM-7AM, please contact night-coverage [email or web address]

## 2019-10-04 NOTE — Plan of Care (Signed)

## 2019-10-04 NOTE — Progress Notes (Signed)
Physical Therapy Treatment Patient Details Name: Monica Garrison MRN: [id number] DOB: [birth year] Today's Date: 10/04/2019    History of Present Illness Pt is a 74 yo admitted after fall at home with left femur fx s/p ORIF and patella fx s/p closed reduction. PMHx: polio with LLE weakness, renal CA s/p left nephrectomy, AVR, HITT, HTN    PT Comments    Pt making steady progress with functional mobility. She continues to require assistance for bed mobility and transfers. Limited this session secondary to L knee pain. Pt would continue to benefit from skilled physical therapy services at this time while admitted and after d/c to address the below listed limitations in order to improve overall safety and independence with functional mobility.   Follow Up Recommendations  Supervision/Assistance - 24 hour;SNF     Equipment Recommendations  Rolling walker with 5" wheels;Wheelchair (measurements PT);Wheelchair cushion (measurements PT)    Recommendations for Other Services       Precautions / Restrictions Precautions Precautions: Fall Restrictions Weight Bearing Restrictions: Yes LLE Weight Bearing: Touchdown weight bearing    Mobility  Bed Mobility Overal bed mobility: Needs Assistance Bed Mobility: Supine to Sit;Sit to Supine     Supine to sit: Min assist Sit to supine: Min assist   General bed mobility comments: increased time and effort, HOB elevated, use of bed rails, min A for management of L LE off of and onto bed  Transfers Overall transfer level: Needs assistance Equipment used: Rolling walker (2 wheeled) Transfers: Sit to/from Stand Sit to Stand: Mod assist;From elevated surface         General transfer comment: multiple attempts needed, use of momentum, cueing for safe hand placement, mod A to power into standing from EOB  Ambulation/Gait             General Gait Details: unable to tolerate this session   Stairs             Wheelchair  Mobility    Modified Rankin (Stroke Patients Only)       Balance Overall balance assessment: Needs assistance;History of Falls Sitting-balance support: No upper extremity supported Sitting balance-Leahy Scale: Fair     Standing balance support: Bilateral upper extremity supported Standing balance-Leahy Scale: Poor                              Cognition Arousal/Alertness: Awake/alert Behavior During Therapy: WFL for tasks assessed/performed Overall Cognitive Status: Within Functional Limits for tasks assessed                                        Exercises Total Joint Exercises Knee Flexion: AROM;Strengthening;Left;10 reps;Seated General Exercises - Lower Extremity Ankle Circles/Pumps: AROM;Left;10 reps;Seated Long Arc Quad: AAROM;Left;10 reps;Seated Hip Flexion/Marching: Seated;AROM;Strengthening;Left;10 reps    General Comments        Pertinent Vitals/Pain Pain Assessment: 0-10 Pain Score: 3  Pain Location: LLE Pain Descriptors / Indicators: Aching;Sore Pain Intervention(s): Monitored during session;Repositioned    Home Living                      Prior Function            PT Goals (current goals can now be found in the care plan section) Acute Rehab PT Goals PT Goal Formulation: With patient Time For Goal Achievement: 10/17/19 Potential to  Achieve Goals: Fair Progress towards PT goals: Progressing toward goals    Frequency    Min 4X/week      PT Plan Current plan remains appropriate    Co-evaluation              AM-PAC PT "6 Clicks" Mobility   Outcome Measure  Help needed turning from your back to your side while in a flat bed without using bedrails?: A Little Help needed moving from lying on your back to sitting on the side of a flat bed without using bedrails?: A Little Help needed moving to and from a bed to a chair (including a wheelchair)?: A Lot Help needed standing up from a chair using your  arms (e.g., wheelchair or bedside chair)?: A Lot Help needed to walk in hospital room?: Total Help needed climbing 3-5 steps with a railing? : Total 6 Click Score: 12    End of Session Equipment Utilized During Treatment: Gait belt Activity Tolerance: Patient limited by pain Patient left: in bed;with call bell/phone within reach Nurse Communication: Mobility status;Weight bearing status;Precautions PT Visit Diagnosis: Other abnormalities of gait and mobility (R26.89);History of falling (Z91.81)     Time: [id number] PT Time Calculation (min) (ACUTE ONLY): 32 min  Charges:  $Therapeutic Exercise: 8-22 mins $Therapeutic Activity: 8-22 mins                     Anastasio Champion, DPT  Acute Rehabilitation Services Pager [phone] Office [place] 10/04/2019, 10:34 AM

## 2019-10-05 LAB — CBC
HCT: 28.9 % — ABNORMAL LOW (ref 36.0–46.0)
Hemoglobin: 9.5 g/dL — ABNORMAL LOW (ref 12.0–15.0)
MCH: 31.6 pg (ref 26.0–34.0)
MCHC: 32.9 g/dL (ref 30.0–36.0)
MCV: 96 fL (ref 80.0–100.0)
Platelets: 129 10*3/uL — ABNORMAL LOW (ref 150–400)
RBC: 3.01 MIL/uL — ABNORMAL LOW (ref 3.87–5.11)
RDW: 13.7 % (ref 11.5–15.5)
WBC: 7.3 10*3/uL (ref 4.0–10.5)
nRBC: 0 % (ref 0.0–0.2)

## 2019-10-05 NOTE — Progress Notes (Signed)
PROGRESS NOTE    Monica Garrison  [id number] DOB: 1946/08/07 DOA: 10/02/2019 PCP: Gale Journey, MD     Brief Narrative:  Monica Garrison is a 73 y.o. femalewith medical history significant forchildhood polio with resulting leg weakness, RCC status post left nephrectomy, bioprosthetic aortic valve replacement,HITT,and hypertension. Patient presented after a fall and suffering a left distal femur fracture. ORIF performed on 10/02/2019.  New events last 24 hours / Subjective: No complaints, pain well controlled.  Tolerated breakfast this morning  Assessment & Plan:   Principal Problem:   Closed fracture of left distal femur (HCC) Active Problems:   Hypertension   Left distal femur fracture -Patient suffered fracture after fall. Patient underwent ORIF of distal femur on 10/02/2019. Pain managed with current analgesic regimen -Orthopedic surgery recommendations: Eliquis for prophylaxis, mobilize with PT/OT -SNF placement pending  Acute blood anemia -Secondary to surgery.  Transfuse for hemoglobin <7  Essential hypertension -Continue metoprolol  Bicuspid valve -S/p bioprosthetic aortic valve replacement  History HITT -Avoid heparin and Lovenox  Vitamin D deficiency -Supplementation mentation ordered   DVT prophylaxis: Eliquis Code Status: Full Family Communication: No family at bedside Disposition Plan: Pending SNF placement   Consultants:   Orthopedic surgery  Procedures:   ORIF 10/02/2019  Antimicrobials:  Anti-infectives (From admission, onward)   Start     Dose/Rate Route Frequency Ordered Stop   10/02/19 1715  ceFAZolin (ANCEF) IVPB 2g/100 mL premix     2 g 200 mL/hr over 30 Minutes Intravenous Every 8 hours 10/02/19 1714 10/03/19 0629   10/02/19 1430  vancomycin (VANCOCIN) powder  Status:  Discontinued       As needed 10/02/19 1431 10/02/19 1616   10/02/19 1330  ceFAZolin (ANCEF) IVPB 2g/100 mL premix     2 g 200 mL/hr over 30  Minutes Intravenous On call to O.R. 10/02/19 1325 10/02/19 1442   10/02/19 1327  ceFAZolin (ANCEF) 2-4 GM/100ML-% IVPB    Note to Pharmacy: Granville Lewis, Lindsi   : cabinet override      10/02/19 1327 10/02/19 1419        Objective: Vitals:   10/04/19 0353 10/04/19 1323 10/05/19 0410 10/05/19 0700  BP: (!) 151/70 134/61 (!) 143/67 (!) 150/65  Pulse: 72 75 71 84  Resp: 18 18    Temp: 98.2 F (36.8 C) 98.7 F (37.1 C) 97.9 F (36.6 C) 98.7 F (37.1 C)  TempSrc: Oral Oral Oral Oral  SpO2: 100% 97% 96% 100%    Intake/Output Summary (Last 24 hours) at 10/05/2019 1326 Last data filed at 10/05/2019 0900 Gross per 24 hour  Intake 480 ml  Output 1300 ml  Net -820 ml   There were no vitals filed for this visit.  Examination:  General exam: Appears calm and comfortable  Respiratory system: Clear to auscultation. Respiratory effort normal. No respiratory distress. No conversational dyspnea.  Cardiovascular system: S1 & S2 heard, RRR. + Murmur Gastrointestinal system: Abdomen is nondistended, soft and nontender. Normal bowel sounds heard. Central nervous system: Alert and oriented. No focal neurological deficits. Speech clear.  Extremities: Symmetric in appearance  Skin: No rashes, lesions or ulcers on exposed skin  Psychiatry: Judgement and insight appear normal. Mood & affect appropriate.   Data Reviewed: I have personally reviewed following labs and imaging studies  CBC: Recent Labs  Lab 10/01/19 1950 10/02/19 0419 10/03/19 0239 10/04/19 1239 10/05/19 0301  WBC 8.0 10.6* 10.0 9.3 7.3  NEUTROABS 5.0  --   --   --   --  HGB 15.2* 13.4 10.7* 10.0* 9.5*  HCT 47.7* 40.7 32.8* 30.7* 28.9*  MCV 93.3 93.6 94.0 95.3 96.0  PLT 144* 148* 112* 138* [id number]*   Basic Metabolic Panel: Recent Labs  Lab 10/01/19 1950 10/02/19 0419  NA 141 140  K 4.2 4.6  CL 105 106  CO2 24 27  GLUCOSE 124* 152*  BUN 23 21  CREATININE 0.86 0.88  CALCIUM 10.4* 10.0   GFR: Estimated Creatinine  Clearance: 72.4 mL/min (by C-G formula based on SCr of 0.88 mg/dL). Liver Function Tests: No results for input(s): AST, ALT, ALKPHOS, BILITOT, PROT, ALBUMIN in the last 168 hours. No results for input(s): LIPASE, AMYLASE in the last 168 hours. No results for input(s): AMMONIA in the last 168 hours. Coagulation Profile: No results for input(s): INR, PROTIME in the last 168 hours. Cardiac Enzymes: No results for input(s): CKTOTAL, CKMB, CKMBINDEX, TROPONINI in the last 168 hours. BNP (last 3 results) No results for input(s): PROBNP in the last 8760 hours. HbA1C: No results for input(s): HGBA1C in the last 72 hours. CBG: No results for input(s): GLUCAP in the last 168 hours. Lipid Profile: No results for input(s): CHOL, HDL, LDLCALC, TRIG, CHOLHDL, LDLDIRECT in the last 72 hours. Thyroid Function Tests: No results for input(s): TSH, T4TOTAL, FREET4, T3FREE, THYROIDAB in the last 72 hours. Anemia Panel: No results for input(s): VITAMINB12, FOLATE, FERRITIN, TIBC, IRON, RETICCTPCT in the last 72 hours. Sepsis Labs: No results for input(s): PROCALCITON, LATICACIDVEN in the last 168 hours.  Recent Results (from the past 240 hour(s))  Respiratory Panel by RT PCR (Flu A&B, Covid) - Nasopharyngeal Swab     Status: None   Collection Time: 10/01/19 10:09 PM   Specimen: Nasopharyngeal Swab  Result Value Ref Range Status   SARS Coronavirus 2 by RT PCR NEGATIVE NEGATIVE Final    Comment: (NOTE) SARS-CoV-2 target nucleic acids are NOT DETECTED. The SARS-CoV-2 RNA is generally detectable in upper respiratoy specimens during the acute phase of infection. The lowest concentration of SARS-CoV-2 viral copies this assay can detect is 131 copies/mL. A negative result does not preclude SARS-Cov-2 infection and should not be used as the sole basis for treatment or other patient management decisions. A negative result may occur with  improper specimen collection/handling, submission of specimen  other than nasopharyngeal swab, presence of viral mutation(s) within the areas targeted by this assay, and inadequate number of viral copies (<131 copies/mL). A negative result must be combined with clinical observations, patient history, and epidemiological information. The expected result is Negative. Fact Sheet for Patients:  PinkCheek.be Fact Sheet for Healthcare Providers:  GravelBags.it This test is not yet ap proved or cleared by the [place] FDA and  has been authorized for detection and/or diagnosis of SARS-CoV-2 by FDA under an Emergency Use Authorization (EUA). This EUA will remain  in effect (meaning this test can be used) for the duration of the COVID-19 declaration under Section 564(b)(1) of the Act, 21 U.S.C. section 360bbb-3(b)(1), unless the authorization is terminated or revoked sooner.    Influenza A by PCR NEGATIVE NEGATIVE Final   Influenza B by PCR NEGATIVE NEGATIVE Final    Comment: (NOTE) The Xpert Xpress SARS-CoV-2/FLU/RSV assay is intended as an aid in  the diagnosis of influenza from Nasopharyngeal swab specimens and  should not be used as a sole basis for treatment. Nasal washings and  aspirates are unacceptable for Xpert Xpress SARS-CoV-2/FLU/RSV  testing. Fact Sheet for Patients: PinkCheek.be Fact Sheet for Healthcare Providers: GravelBags.it This test  is not yet approved or cleared by the [place] and  has been authorized for detection and/or diagnosis of SARS-CoV-2 by  FDA under an Emergency Use Authorization (EUA). This EUA will remain  in effect (meaning this test can be used) for the duration of the  Covid-19 declaration under Section 564(b)(1) of the Act, 21  U.S.C. section 360bbb-3(b)(1), unless the authorization is  terminated or revoked. Performed at [facility], [address] [place], [place]  [address]   MRSA PCR Screening     Status: None   Collection Time: 10/02/19  3:45 AM   Specimen: Nasal Mucosa; Nasopharyngeal  Result Value Ref Range Status   MRSA by PCR NEGATIVE NEGATIVE Final    Comment:        The GeneXpert MRSA Assay (FDA approved for NASAL specimens only), is one component of a comprehensive MRSA colonization surveillance program. It is not intended to diagnose MRSA infection nor to guide or monitor treatment for MRSA infections. Performed at [facility], [address] [place], [place] [address]       Radiology Studies: No results found.    Scheduled Meds: . acetaminophen  650 mg Oral Q6H  . apixaban  2.5 mg Oral BID  . Chlorhexidine Gluconate Cloth  6 each Topical Daily  . cholecalciferol  2,000 Units Oral BID  . gabapentin  100 mg Oral TID  . influenza vaccine adjuvanted  0.5 mL Intramuscular Tomorrow-1000  . metoprolol tartrate  50 mg Oral BID  . polyethylene glycol  17 g Oral Daily   Continuous Infusions: . lactated ringers    . methocarbamol (ROBAXIN) IV       LOS: 3 days      Time spent: 35 minutes   Dessa Phi, DO [facility] 10/05/2019, 1:26 PM   Available via Epic secure chat 7am-7pm After these hours, please refer to coverage provider listed on [email or web address]

## 2019-10-05 NOTE — Anesthesia Postprocedure Evaluation (Signed)
Anesthesia Post Note  Patient: Monica Garrison  Procedure(s) Performed: OPEN REDUCTION INTERNAL FIXATION DISTAL FEMUR FRACTURE (Left )     Patient location during evaluation: PACU Anesthesia Type: General Level of consciousness: awake Pain management: pain level controlled Vital Signs Assessment: post-procedure vital signs reviewed and stable Respiratory status: spontaneous breathing Cardiovascular status: stable Postop Assessment: no apparent nausea or vomiting Anesthetic complications: no    Last Vitals:  Vitals:   10/05/19 0700 10/05/19 1335  BP: (!) 150/65 (!) 172/72  Pulse: 84 60  Resp:    Temp: 37.1 C 36.9 C  SpO2: 100% 99%    Last Pain:  Vitals:   10/05/19 1335  TempSrc: Oral  PainSc:    Pain Goal:                   Huston Foley

## 2019-10-05 NOTE — Plan of Care (Signed)

## 2019-10-06 ENCOUNTER — Encounter: Payer: Self-pay | Admitting: *Deleted

## 2019-10-06 LAB — CBC
HCT: 30.6 % — ABNORMAL LOW (ref 36.0–46.0)
Hemoglobin: 10 g/dL — ABNORMAL LOW (ref 12.0–15.0)
MCH: 31.2 pg (ref 26.0–34.0)
MCHC: 32.7 g/dL (ref 30.0–36.0)
MCV: 95.3 fL (ref 80.0–100.0)
Platelets: 193 10*3/uL (ref 150–400)
RBC: 3.21 MIL/uL — ABNORMAL LOW (ref 3.87–5.11)
RDW: 13.6 % (ref 11.5–15.5)
WBC: 6.5 10*3/uL (ref 4.0–10.5)
nRBC: 0 % (ref 0.0–0.2)

## 2019-10-06 LAB — SARS CORONAVIRUS 2 (TAT 6-24 HRS): SARS Coronavirus 2: NEGATIVE

## 2019-10-06 MED ORDER — GABAPENTIN 100 MG PO CAPS: 100.0000 mg | ORAL_CAPSULE | Freq: Three times a day (TID) | ORAL | 0 refills | Status: AC

## 2019-10-06 MED ORDER — APIXABAN 2.5 MG PO TABS: 2.5000 mg | ORAL_TABLET | Freq: Two times a day (BID) | ORAL | 0 refills | Status: AC

## 2019-10-06 MED ORDER — METHOCARBAMOL 500 MG PO TABS: 500.0000 mg | ORAL_TABLET | Freq: Four times a day (QID) | ORAL | 0 refills | Status: AC | PRN

## 2019-10-06 MED ORDER — ASPIRIN EC 81 MG PO TBEC: 81.0000 mg | DELAYED_RELEASE_TABLET | Freq: Every day | ORAL | Status: AC

## 2019-10-06 MED ORDER — TRAMADOL HCL 50 MG PO TABS: 50.0000 mg | ORAL_TABLET | Freq: Four times a day (QID) | ORAL | 0 refills | Status: AC | PRN

## 2019-10-06 NOTE — Plan of Care (Signed)
  Problem: Pain Managment: Goal: General experience of comfort will improve Outcome: Progressing   Problem: Safety: Goal: Ability to remain free from injury will improve Outcome: Progressing   Problem: Skin Integrity: Goal: Risk for impaired skin integrity will decrease Outcome: Progressing   

## 2019-10-06 NOTE — Care Management Important Message (Signed)
Important Message  Patient Details  Name: Monica Garrison MRN: [id number] Date of Birth: April 27, 1946   Medicare Important Message Given:  Yes     Memory Argue 10/06/2019, 3:02 PM

## 2019-10-06 NOTE — TOC Initial Note (Signed)
Transition of Care [facility]) - Initial/Assessment Note    Patient Details  Name: Monica Garrison MRN: [id number] Date of Birth: 1946/03/25  Transition of Care [facility]) CM/SW Contact:    Midge Minium MSN, RN, NCM-BC, ACM-RN [phone] Phone Number: 10/06/2019, 10:17 AM  Clinical Narrative:                 CM following for transitional needs. CM spoke to the patient to discuss the POC. The patient states living at [place] with her spouse PTA. Patient is an 74 yo admitted after fall at home with left femur fx s/p ORIF and patella fx s/p closed reduction. PMHx: polio with LLE weakness, renal CA s/p left nephrectomy, AVR, HITT, HTN. PT/OT eval completed with SNF recommended with the patient agreeable. Patient is requesting [facility] as her 1st choice. FL2/[place] MUST completed with the referral faxed to the [facility] Admissions Coordinator; awaiting bed availability and COVID results. CM will continue to follow.   Expected Discharge Plan: Skilled Nursing Facility Barriers to Discharge: SNF Pending bed offer, Other (comment)(COVID results)   Patient Goals and CMS Choice Patient states their goals for this hospitalization and ongoing recovery are:: "return home and to painting" CMS Medicare.gov Compare Post Acute Care list provided to:: Patient Choice offered to / list presented to : Patient  Expected Discharge Plan and Services Expected Discharge Plan: [place] In-house Referral: NA Discharge Planning Services: CM Consult Post Acute Care Choice: [place] Living arrangements for the past 2 months: [place]                 DME Arranged: N/A DME Agency: NA       HH Arranged: NA [place] Agency: NA        Prior Living Arrangements/Services Living arrangements for the past 2 months: [place] Lives with:: Self, Spouse Patient language and need for interpreter reviewed:: Yes Do you feel safe going  back to the place where you live?: Yes      Need for Family Participation in Patient Care: Yes (Comment) Care giver support system in place?: Yes (comment) Current home services: DME Criminal Activity/Legal Involvement Pertinent to Current Situation/Hospitalization: No - Comment as needed  Activities of Daily Living Home Assistive Devices/Equipment: Cane (specify quad or straight) ADL Screening (condition at time of admission) Patient's cognitive ability adequate to safely complete daily activities?: Yes Is the patient deaf or have difficulty hearing?: No Does the patient have difficulty seeing, even when wearing glasses/contacts?: No Does the patient have difficulty concentrating, remembering, or making decisions?: No Patient able to express need for assistance with ADLs?: Yes Does the patient have difficulty dressing or bathing?: No Independently performs ADLs?: Yes (appropriate for developmental age) Does the patient have difficulty walking or climbing stairs?: Yes Weakness of Legs: Left Weakness of Arms/Hands: None  Permission Sought/Granted Permission sought to share information with : Case Manager, Customer service manager Permission granted to share information with : Yes, Verbal Permission Granted     Permission granted to share info w AGENCY: [facility] facility        Emotional Assessment   Attitude/Demeanor/Rapport: Gracious Affect (typically observed): Happy, Pleasant, Appropriate Orientation: : Oriented to Self, Oriented to Place, Oriented to  Time, Oriented to Situation Alcohol / Substance Use: Not Applicable Psych Involvement: No (comment)  Admission diagnosis:  Closed fracture of distal end of left femur, unspecified fracture morphology, initial encounter [facility]) [S72.402A] Patient Active Problem List  Diagnosis Date Noted  . Hypertension   . Closed fracture of left distal femur ([place])    PCP:  Gale Journey, MD Pharmacy:   [id number] -  [place], [place] [address] [place] [address] Phone: [phone] Fax: [phone]     Social Determinants of Health (SDOH) Interventions    Readmission Risk Interventions No flowsheet data found.

## 2019-10-06 NOTE — Progress Notes (Signed)
Physical Therapy Treatment Patient Details Name: Adrionna Fetsch MRN: [id number] DOB: Sep 19, 1946 Today's Date: 10/06/2019    History of Present Illness Pt is a 74 yo admitted after fall at home with left femur fx s/p ORIF and patella fx s/p closed reduction. PMHx: polio with LLE weakness, renal CA s/p left nephrectomy, AVR, HITT, HTN    PT Comments    Pt pleasant and agreeable to therapy. Pt able to state need to achieve WC level mobility sufficiently at this time due to weakness and limited function with hopping. Pt with improved ability with bil squat pivot transfers this time with limited gait attempted for strengthening and balance. Pt educated for HEp as well and will add WC mobility goal.     Follow Up Recommendations  Supervision/Assistance - 24 hour;SNF     Equipment Recommendations  Rolling walker with 5" wheels;Wheelchair (measurements PT);Wheelchair cushion (measurements PT)    Recommendations for Other Services       Precautions / Restrictions Precautions Precautions: Fall Restrictions Weight Bearing Restrictions: Yes LLE Weight Bearing: Touchdown weight bearing    Mobility  Bed Mobility Overal bed mobility: Needs Assistance Bed Mobility: Supine to Sit;Sit to Supine     Supine to sit: Min guard;HOB elevated Sit to supine: Min assist   General bed mobility comments: HOB elevated with use of rail to pivot to left side of bed. Return to bed with assist to bring LLE to surface  Transfers Overall transfer level: Needs assistance   Transfers: Sit to/from Stand;Squat Pivot Transfers Sit to Stand: Min assist;+2 safety/equipment   Squat pivot transfers: Min assist;+2 physical assistance     General transfer comment: pt able to pivot bil bed<>recliner with cues for sequence, assist for setup and min +2 to complete transfer. pt declined remaining in chair end of session as she did not feel comfortable with nursing assist for  transfers  Ambulation/Gait Ambulation/Gait assistance: Min assist Gait Distance (Feet): 2 Feet Assistive device: Rolling walker (2 wheeled) Gait Pattern/deviations: Step-to pattern;Decreased step length - right   Gait velocity interpretation: <1.31 ft/sec, indicative of household ambulator General Gait Details: pt with very short hops taking grossly 7 hops to achieve 2' of distance. Pt maintains RW very close to her and not able to progress RW or hopping distance to decrease fatigue. Chair pulled to pt   Stairs             Wheelchair Mobility    Modified Rankin (Stroke Patients Only)       Balance Overall balance assessment: Needs assistance;History of Falls Sitting-balance support: No upper extremity supported Sitting balance-Leahy Scale: Good     Standing balance support: Bilateral upper extremity supported Standing balance-Leahy Scale: Poor Standing balance comment: reliant on bil UE to maintain TDWB                            Cognition Arousal/Alertness: Awake/alert Behavior During Therapy: WFL for tasks assessed/performed Overall Cognitive Status: Within Functional Limits for tasks assessed                                        Exercises General Exercises - Lower Extremity Long Arc QuadSinclair Ship;Left;10 reps;Seated Hip ABduction/ADduction: AAROM;Left;Supine;10 reps Hip Flexion/Marching: Seated;AROM;Strengthening;Left;10 reps    General Comments        Pertinent Vitals/Pain Pain Score: 3  Pain Location: LLE Pain Descriptors /  Indicators: Aching;Sore Pain Intervention(s): Monitored during session;Repositioned;Premedicated before session    Home Living                      Prior Function            PT Goals (current goals can now be found in the care plan section) Progress towards PT goals: Progressing toward goals    Frequency    Min 3X/week      PT Plan Current plan remains appropriate;Frequency  needs to be updated    Co-evaluation              AM-PAC PT "6 Clicks" Mobility   Outcome Measure  Help needed turning from your back to your side while in a flat bed without using bedrails?: A Little Help needed moving from lying on your back to sitting on the side of a flat bed without using bedrails?: A Little Help needed moving to and from a bed to a chair (including a wheelchair)?: A Lot Help needed standing up from a chair using your arms (e.g., wheelchair or bedside chair)?: A Little Help needed to walk in hospital room?: A Lot Help needed climbing 3-5 steps with a railing? : Total 6 Click Score: 14    End of Session Equipment Utilized During Treatment: Gait belt Activity Tolerance: Patient tolerated treatment well Patient left: in bed;with call bell/phone within reach;with bed alarm set Nurse Communication: Mobility status;Weight bearing status;Precautions PT Visit Diagnosis: Other abnormalities of gait and mobility (R26.89);History of falling (Z91.81)     Time: [id number] PT Time Calculation (min) (ACUTE ONLY): 23 min  Charges:  $Therapeutic Exercise: 8-22 mins $Therapeutic Activity: 8-22 mins                     Noralyn Karim P, PT Acute Rehabilitation Services Pager: [phone] Office: [place] Tuyet Bader 10/06/2019, 1:47 PM

## 2019-10-06 NOTE — Plan of Care (Signed)
  Problem: Clinical Measurements: Goal: Respiratory complications will improve Outcome: Progressing Note: On room air   Problem: Nutrition: Goal: Adequate nutrition will be maintained Outcome: Progressing   Problem: Coping: Goal: Level of anxiety will decrease Outcome: Progressing   Problem: Pain Managment: Goal: General experience of comfort will improve Outcome: Progressing Note: Pain tolerated very well with just scheduled tylenol and gabapentin   Problem: Safety: Goal: Ability to remain free from injury will improve Outcome: Progressing   Problem: Skin Integrity: Goal: Risk for impaired skin integrity will decrease Outcome: Progressing

## 2019-10-06 NOTE — Progress Notes (Signed)
Orthopaedic Trauma Progress Note  S: Pain controlled. Awaiting rehab  O:  Vitals:   10/06/19 1438 10/06/19 1637  BP: (!) 144/55 (!) 158/72  Pulse: 72 68  Resp:  16  Temp: 98 F (36.7 C) 97.8 F (36.6 C)  SpO2: 99% 98%    General -sitting up in bed, no acute distress.  Awake alert and oriented.  Pleasant and cooperative.  Left lower extremity - Incision clean, dry, and intact.  Tolerates gentle ROM .+ EHL.+FHL.  Compartments soft and compressible.  Sensation intact to light touch distally.  2+ DP pulse  Imaging: Stable post op imaging.   Labs:  Results for orders placed or performed during the hospital encounter of 10/02/19 (from the past 24 hour(s))  Daily CBC     Status: Abnormal   Collection Time: 10/06/19  3:29 AM  Result Value Ref Range   WBC 6.5 4.0 - 10.5 K/uL   RBC 3.21 (L) 3.87 - 5.11 MIL/uL   Hemoglobin 10.0 (L) 12.0 - 15.0 g/dL   HCT 30.6 (L) 36.0 - 46.0 %   MCV 95.3 80.0 - 100.0 fL   MCH 31.2 26.0 - 34.0 pg   MCHC 32.7 30.0 - 36.0 g/dL   RDW 13.6 11.5 - 15.5 %   Platelets 193 150 - 400 K/uL   nRBC 0.0 0.0 - 0.2 %  SARS CORONAVIRUS 2 (TAT 6-24 HRS) Nasopharyngeal Nasopharyngeal Swab     Status: None   Collection Time: 10/06/19  9:30 AM   Specimen: Nasopharyngeal Swab  Result Value Ref Range   SARS Coronavirus 2 NEGATIVE NEGATIVE    Assessment: 74 year old female status post fall  Injuries: Left intra-articular distal femur fracture status post ORIF  Weightbearing: TDWB LLE  Insicional and dressing care: Dressing PRN  Showering: Okay to shower  Orthopedic device(s): None   CV/Blood loss: Acute blood loss anemia, Hgb stable  Pain management:  1. Tylenol 650 mg q 6 hours scheduled 2. Robaxin 500 mg q 6 hours PRN 3. Tramadol 50-100 mg q 6 hours PRN 4. Neurontin 100 mg TID 5. Morphine 1-2 mg q 3 hours PRN  VTE prophylaxis: Eliquis 2.5 mg twice daily  ID:  Ancef 2gm post op completed  Foley/Lines:  No foley, KVO IVFs  Medical co-morbidities:  childhood polio with resulting leg weakness, RCC s/p left nephrectomy, aortic valve replacement, HITT, Hypertension  Dispo: PT/OT-SNF   Follow - up plan: 2 weeks for repeat x-rays  Contact information:  Katha Hamming MD, Patrecia Pace PA-C   Shona Needles, MD [facility] [phone] (office) [email or web address]

## 2019-10-06 NOTE — Care Management (Addendum)
Patient has been accepted to [facility] pending COVID results. CM updated the patient on the POC, with the patient verbalizing understanding.   Addendum: 10/06/19 @ 1450-CM spoke to Seth Bake [facility] Admissions Coordinator); the facility has a current COVID outbreak. Per Seth Bake, the facility can accept the patient on 1/5, if the patient is still agreeable to transitioning to the facility.   CM updated the patient on the bed availability and the COVID outbreak at the facility. Patient requesting to discuss the POC with her spouse, and is agreeable to contacting the Admissions Coordinator to discuss the facility/COVID outbreak prior to making a final decision.  Call received from the patient. The patient would like to accept the bed offer at [facility]. CM updated Dr. Maylene Roes.  Midge Minium MSN, RN, NCM-BC, ACM-RN [phone]

## 2019-10-06 NOTE — Discharge Summary (Addendum)
Physician Discharge Summary  Monica Garrison [id number] DOB: 1946-10-02 DOA: 10/02/2019  PCP: Gale Journey, MD  Admit date: 10/02/2019 Discharge date: 10/07/2019  Admitted From: Home Disposition:  SNF   Recommendations for Outpatient Follow-up:  1. Follow up with Dr. Doreatha Martin 2 weeks   Discharge Condition: Stable CODE STATUS: Full  Diet recommendation: Heart healthy   Brief/Interim Summary: Monica Garrison a 74 y.o.femalewith medical history significant forchildhood polio with resulting leg weakness, RCC status post left nephrectomy, bioprosthetic aortic valve replacement,HIT,and hypertension. Patient presented after a fall and suffering a left distal femur fracture. ORIF performed on 10/02/2019.  Discharge Diagnoses:  Principal Problem:   Closed fracture of left distal femur ([place]) Active Problems:   Hypertension   Left distal femur fracture -Patient suffered fracture after fall. Patient underwent ORIF of distal femur on 10/02/2019. Pain managed with current analgesic regimen -Orthopedic surgery recommendations: Eliquis for prophylaxis, mobilize with PT/OT -SNF placement recommended   Acute blood anemia -Secondary to surgery.  Transfuse for hemoglobin <7  Essential hypertension -Continue metoprolol  Bicuspid valve -S/p bioprosthetic aortic valve replacement  History HITT -Avoid heparin and Lovenox  Vitamin D deficiency -Supplementation mentation ordered   Discharge Instructions  Discharge Instructions    Call MD for:  difficulty breathing, headache or visual disturbances   Complete by: As directed    Call MD for:  extreme fatigue   Complete by: As directed    Call MD for:  persistant dizziness or light-headedness   Complete by: As directed    Call MD for:  persistant nausea and vomiting   Complete by: As directed    Call MD for:  redness, tenderness, or signs of infection (pain, swelling, redness, odor or green/yellow discharge around  incision site)   Complete by: As directed    Call MD for:  severe uncontrolled pain   Complete by: As directed    Call MD for:  temperature >100.4   Complete by: As directed    Diet - low sodium heart healthy   Complete by: As directed    Increase activity slowly   Complete by: As directed      Allergies as of 10/06/2019      Reactions   Enalapril Maleate Cough   Arthralgia (severe joint pain)   Heparin Other (See Comments)   HITT in 2016 per patient report   Nickel Dermatitis   Other Dermatitis   Chromium      Medication List    TAKE these medications   acetaminophen 500 MG tablet Commonly known as: TYLENOL Take 1,000 mg by mouth every 6 (six) hours as needed for mild pain.   apixaban 2.5 MG Tabs tablet Commonly known as: ELIQUIS Take 1 tablet (2.5 mg total) by mouth 2 (two) times daily.   aspirin EC 81 MG tablet Take 1 tablet (81 mg total) by mouth daily. Hold off on aspirin while on Eliquis. Resume aspirin once eliquis for DVT prophylaxis post-surgery is completed per orthopedic surgery. What changed: additional instructions   gabapentin 100 MG capsule Commonly known as: NEURONTIN Take 1 capsule (100 mg total) by mouth 3 (three) times daily.   methocarbamol 500 MG tablet Commonly known as: ROBAXIN Take 1 tablet (500 mg total) by mouth every 6 (six) hours as needed for muscle spasms.   metoprolol tartrate 50 MG tablet Commonly known as: LOPRESSOR Take 50 mg by mouth 2 (two) times daily.   traMADol 50 MG tablet Commonly known as: ULTRAM Take 1-2 tablets (50-100 mg total)  by mouth every 6 (six) hours as needed for moderate pain or severe pain (50 mg moderate, 100 mg severe pain).            Durable Medical Equipment  (From admission, onward)         Start     Ordered   10/04/19 1120  For home use only DME Walker rolling  Once    Question:  Patient needs a walker to treat with the following condition  Answer:  Closed fracture of left distal femur ([place])    10/04/19 1120         Follow-up Information    Haddix, Thomasene Lot, MD. Schedule an appointment as soon as possible for a visit in 2 week(s).   Specialty: Orthopedic Surgery Why: Follow up repeat X ray  Contact information: [place] [address] [phone]          Allergies  Allergen Reactions  . Enalapril Maleate Cough    Arthralgia (severe joint pain)  . 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 Nickel Dermatitis  . Other Dermatitis    Chromium    Consultations:  Orthopedic surgery    Procedures/Studies: CT KNEE LEFT WO CONTRAST  Result Date: 10/02/2019 CLINICAL DATA:  Distal femoral fracture EXAM: CT OF THE LEFT KNEE WITHOUT CONTRAST TECHNIQUE: Multidetector CT imaging of the left knee was performed according to the standard protocol. Multiplanar CT image reconstructions were also generated. COMPARISON:  X-ray 10/01/2019 FINDINGS: Bones/Joint/Cartilage Acute comminuted fracture of the distal femoral metaphysis with approximately 1 cm of impaction and approximately 1.4 cm of medial displacement. There is external rotation at the fracture site. Nondisplaced fracture components extend to the lateral femoral condyle articular surface as well as into the intercondylar notch. There is a comminuted, predominantly vertically-oriented fracture through the patella (series 3, image 111) without significant displacement. The visualized proximal tibia and fibula are intact without fracture. Heterogeneity of the osseous structures suggesting osteopenia. Mild to moderate medial compartment joint space narrowing of the left knee. Moderate size knee joint lipohemarthrosis. Ligaments Suboptimally assessed by CT. Muscles and Tendons Moderate diffuse muscular atrophy and severe fatty infiltration of the visualized calf musculature. No gross tendinous abnormality within the limitations of this exam. Soft tissues Soft tissue hemorrhage at the fracture  site without a well-defined hematoma. IMPRESSION: 1. Acute comminuted fracture of the distal femoral metaphysis with approximately 1 cm of impaction and approximately 1.4 cm of medial displacement. 2. Comminuted, predominantly vertically-oriented fracture through the patella without significant displacement. 3. Moderate size knee joint lipohemarthrosis. 4. Moderate diffuse muscular atrophy and severe fatty infiltration of the visualized calf musculature, likely related to reported history of polio. Electronically Signed   By: Davina Poke D.O.   On: 10/02/2019 14:57   Chest Portable 1 View  Result Date: 10/02/2019 CLINICAL DATA:  Preoperative evaluation for left femur fracture. EXAM: PORTABLE CHEST 1 VIEW COMPARISON:  None FINDINGS: Signs of aortic valve replacement and median sternotomy. Cardiomediastinal contours are enlarged, mildly. Lungs are clear. No signs of pleural effusion. No acute bone process. IMPRESSION: 1. No active cardiopulmonary disease. 2. Signs of aortic valve replacement and median sternotomy. Electronically Signed   By: Zetta Bills M.D.   On: 10/02/2019 09:16   DG Knee Complete 4 Views Left  Result Date: 10/01/2019 CLINICAL DATA:  Pain status post fall EXAM: LEFT KNEE - COMPLETE 4+ VIEW COMPARISON:  None. FINDINGS: There is an acute impacted femoral condyle  fracture. The fracture is impacted there is surrounding soft tissue swelling. The fracture plane appears to extend into the articular surface of the lateral femoral condyle. There is a large joint effusion. There is no dislocation. IMPRESSION: Acute impacted displaced fracture of the distal femur as detailed above. The fracture plane appears to extend to the articular surface of the lateral femoral condyle. Electronically Signed   By: Constance Holster M.D.   On: 10/01/2019 20:32   Left Knee  Result Date: 10/02/2019 CLINICAL DATA:  Status post surgical internal fixation of left distal femoral fracture. EXAM: PORTABLE  LEFT KNEE - 1-2 VIEW COMPARISON:  October 01, 2019. FINDINGS: Status post surgical internal fixation of distal left femoral fracture. There is improved alignment of fracture components compared to prior radiographs. Expected postoperative changes are noted in the soft tissues anteriorly. IMPRESSION: Status post surgical internal fixation of distal left femoral fracture with improved alignment of fracture components compared to prior radiographs. Electronically Signed   By: Marijo Conception M.D.   On: 10/02/2019 17:52   DG C-Arm 1-60 Min  Result Date: 10/02/2019 CLINICAL DATA:  Distal right femur fracture ORIF EXAM: DG C-ARM 1-60 MIN; LEFT FEMUR 2 VIEWS COMPARISON:  10/01/2019 FINDINGS: 6 C-arm fluoroscopic images were obtained intraoperatively and submitted for post operative interpretation. Comminuted mildly displaced distal left femoral metaphyseal fracture with subsequent placement of lateral sideplate and screw fixation construct. Improved fracture alignment status post hardware placement without evidence of complication on the provided images. Fluoroscopy time of 2 minutes and 12 seconds was utilized. Please see the performing provider's procedural report for further detail. IMPRESSION: As above. Electronically Signed   By: Davina Poke D.O.   On: 10/02/2019 16:12   DG Hip Unilat W or Wo Pelvis 2-3 Views Left  Result Date: 10/01/2019 CLINICAL DATA:  Pain status post fall EXAM: DG HIP (WITH OR WITHOUT PELVIS) 2-3V LEFT COMPARISON:  None. FINDINGS: There is no acute displaced fracture. No dislocation. 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 IMPRESSION: No acute osseous abnormality. Electronically Signed   By: Constance Holster M.D.   On: 10/01/2019 20:29   DG FEMUR MIN 2 VIEWS LEFT  Result Date: 10/02/2019 CLINICAL DATA:  Distal right femur fracture ORIF EXAM: DG C-ARM 1-60 MIN; LEFT FEMUR 2 VIEWS COMPARISON:  10/01/2019 FINDINGS: 6 C-arm fluoroscopic images were obtained intraoperatively  and submitted for post operative interpretation. Comminuted mildly displaced distal left femoral metaphyseal fracture with subsequent placement of lateral sideplate and screw fixation construct. Improved fracture alignment status post hardware placement without evidence of complication on the provided images. Fluoroscopy time of 2 minutes and 12 seconds was utilized. Please see the performing provider's procedural report for further detail. IMPRESSION: As above. Electronically Signed   By: Davina Poke D.O.   On: 10/02/2019 16:12        Discharge Exam: Vitals:   10/06/19 0314 10/06/19 1001  BP: (!) 169/72 (!) 168/77  Pulse: 70 76  Resp:  18  Temp: 98 F (36.7 C) 98.1 F (36.7 C)  SpO2: 97% 98%    General: Pt is alert, awake, not in acute distress Cardiovascular: RRR, S1/S2 +, no edema, +murmur  Respiratory: CTA bilaterally, no wheezing, no rhonchi, no respiratory distress, no conversational dyspnea  Abdominal: Soft, NT, ND, bowel sounds + Extremities: no edema, no cyanosis Psych: Normal mood and affect, stable judgement and insight     The results of significant diagnostics from this hospitalization (including imaging, microbiology, ancillary and laboratory) are listed below for  reference.     Microbiology: Recent Results (from the past 240 hour(s))  Respiratory Panel by RT PCR (Flu A&B, Covid) - Nasopharyngeal Swab     Status: None   Collection Time: 10/01/19 10:09 PM   Specimen: Nasopharyngeal Swab  Result Value Ref Range Status   SARS Coronavirus 2 by RT PCR NEGATIVE NEGATIVE Final    Comment: (NOTE) SARS-CoV-2 target nucleic acids are NOT DETECTED. The SARS-CoV-2 RNA is generally detectable in upper respiratoy specimens during the acute phase of infection. The lowest concentration of SARS-CoV-2 viral copies this assay can detect is 131 copies/mL. A negative result does not preclude SARS-Cov-2 infection and should not be used as the sole basis for treatment or other  patient management decisions. A negative result may occur with  improper specimen collection/handling, submission of specimen other than nasopharyngeal swab, presence of viral mutation(s) within the areas targeted by this assay, and inadequate number of viral copies (<131 copies/mL). A negative result must be combined with clinical observations, patient history, and epidemiological information. The expected result is Negative. Fact Sheet for Patients:  PinkCheek.be Fact Sheet for Healthcare Providers:  GravelBags.it This test is not yet ap proved or cleared by the [place] FDA and  has been authorized for detection and/or diagnosis of SARS-CoV-2 by FDA under an Emergency Use Authorization (EUA). This EUA will remain  in effect (meaning this test can be used) for the duration of the COVID-19 declaration under Section 564(b)(1) of the Act, 21 U.S.C. section 360bbb-3(b)(1), unless the authorization is terminated or revoked sooner.    Influenza A by PCR NEGATIVE NEGATIVE Final   Influenza B by PCR NEGATIVE NEGATIVE Final    Comment: (NOTE) The Xpert Xpress SARS-CoV-2/FLU/RSV assay is intended as an aid in  the diagnosis of influenza from Nasopharyngeal swab specimens and  should not be used as a sole basis for treatment. Nasal washings and  aspirates are unacceptable for Xpert Xpress SARS-CoV-2/FLU/RSV  testing. Fact Sheet for Patients: PinkCheek.be Fact Sheet for Healthcare Providers: GravelBags.it This test is not yet approved or cleared by the [place] FDA and  has been authorized for detection and/or diagnosis of SARS-CoV-2 by  FDA under an Emergency Use Authorization (EUA). This EUA will remain  in effect (meaning this test can be used) for the duration of the  Covid-19 declaration under Section 564(b)(1) of the Act, 21  U.S.C. section 360bbb-3(b)(1), unless  the authorization is  terminated or revoked. Performed at [facility], [address] [place], [place] [address]   MRSA PCR Screening     Status: None   Collection Time: 10/02/19  3:45 AM   Specimen: Nasal Mucosa; Nasopharyngeal  Result Value Ref Range Status   MRSA by PCR NEGATIVE NEGATIVE Final    Comment:        The GeneXpert MRSA Assay (FDA approved for NASAL specimens only), is one component of a comprehensive MRSA colonization surveillance program. It is not intended to diagnose MRSA infection nor to guide or monitor treatment for MRSA infections. Performed at [facility], [place] [address] [place], [place] [address]   SARS CORONAVIRUS 2 (TAT 6-24 HRS) Nasopharyngeal Nasopharyngeal Swab     Status: None   Collection Time: 10/06/19  9:30 AM   Specimen: Nasopharyngeal Swab  Result Value Ref Range Status   SARS Coronavirus 2 NEGATIVE NEGATIVE Final    Comment: (NOTE) SARS-CoV-2 target nucleic acids are NOT DETECTED. The SARS-CoV-2 RNA is generally detectable in upper and lower respiratory specimens  during the acute phase of infection. Negative results do not preclude SARS-CoV-2 infection, do not rule out co-infections with other pathogens, and should not be used as the sole basis for treatment or other patient management decisions. Negative results must be combined with clinical observations, patient history, and epidemiological information. The expected result is Negative. Fact Sheet for Patients: SugarRoll.be Fact Sheet for Healthcare Providers: https://www.woods-mathews.com/ This test is not yet approved or cleared by the [place] FDA and  has been authorized for detection and/or diagnosis of SARS-CoV-2 by FDA under an Emergency Use Authorization (EUA). This EUA will remain  in effect (meaning this test can be used) for the duration of the COVID-19 declaration under Section 56 4(b)(1) of the Act, 21  U.S.C. section 360bbb-3(b)(1), unless the authorization is terminated or revoked sooner. Performed at [facility], [place] [address], [place], [place] [address]      Labs: BNP (last 3 results) No results for input(s): BNP in the last 8760 hours. Basic Metabolic Panel: Recent Labs  Lab 10/01/19 1950 10/02/19 0419  NA 141 140  K 4.2 4.6  CL 105 106  CO2 24 27  GLUCOSE 124* 152*  BUN 23 21  CREATININE 0.86 0.88  CALCIUM 10.4* 10.0   Liver Function Tests: No results for input(s): AST, ALT, ALKPHOS, BILITOT, PROT, ALBUMIN in the last 168 hours. No results for input(s): LIPASE, AMYLASE in the last 168 hours. No results for input(s): AMMONIA in the last 168 hours. CBC: Recent Labs  Lab 10/01/19 1950 10/02/19 0419 10/03/19 0239 10/04/19 1239 10/05/19 0301 10/06/19 0329  WBC 8.0 10.6* 10.0 9.3 7.3 6.5  NEUTROABS 5.0  --   --   --   --   --   HGB 15.2* 13.4 10.7* 10.0* 9.5* 10.0*  HCT 47.7* 40.7 32.8* 30.7* 28.9* 30.6*  MCV 93.3 93.6 94.0 95.3 96.0 95.3  PLT 144* 148* 112* 138* 129* 193   Cardiac Enzymes: No results for input(s): CKTOTAL, CKMB, CKMBINDEX, TROPONINI in the last 168 hours. BNP: Invalid input(s): POCBNP CBG: No results for input(s): GLUCAP in the last 168 hours. D-Dimer No results for input(s): DDIMER in the last 72 hours. Hgb A1c No results for input(s): HGBA1C in the last 72 hours. Lipid Profile No results for input(s): CHOL, HDL, LDLCALC, TRIG, CHOLHDL, LDLDIRECT in the last 72 hours. Thyroid function studies No results for input(s): TSH, T4TOTAL, T3FREE, THYROIDAB in the last 72 hours.  Invalid input(s): FREET3 Anemia work up No results for input(s): VITAMINB12, FOLATE, FERRITIN, TIBC, IRON, RETICCTPCT in the last 72 hours. Urinalysis No results found for: COLORURINE, APPEARANCEUR, [place], [place], [place], [place], [place], [place], PROTEINUR, UROBILINOGEN, NITRITE, LEUKOCYTESUR Sepsis Labs Invalid input(s): PROCALCITONIN,  WBC,   LACTICIDVEN Microbiology Recent Results (from the past 240 hour(s))  Respiratory Panel by RT PCR (Flu A&B, Covid) - Nasopharyngeal Swab     Status: None   Collection Time: 10/01/19 10:09 PM   Specimen: Nasopharyngeal Swab  Result Value Ref Range Status   SARS Coronavirus 2 by RT PCR NEGATIVE NEGATIVE Final    Comment: (NOTE) SARS-CoV-2 target nucleic acids are NOT DETECTED. The SARS-CoV-2 RNA is generally detectable in upper respiratoy specimens during the acute phase of infection. The lowest concentration of SARS-CoV-2 viral copies this assay can detect is 131 copies/mL. A negative result does not preclude SARS-Cov-2 infection and should not be used as the sole basis for treatment or other patient management decisions. A negative result may occur with  improper specimen collection/handling, submission of  specimen other than nasopharyngeal swab, presence of viral mutation(s) within the areas targeted by this assay, and inadequate number of viral copies (<131 copies/mL). A negative result must be combined with clinical observations, patient history, and epidemiological information. The expected result is Negative. Fact Sheet for Patients:  PinkCheek.be Fact Sheet for Healthcare Providers:  GravelBags.it This test is not yet ap proved or cleared by the [place] FDA and  has been authorized for detection and/or diagnosis of SARS-CoV-2 by FDA under an Emergency Use Authorization (EUA). This EUA will remain  in effect (meaning this test can be used) for the duration of the COVID-19 declaration under Section 564(b)(1) of the Act, 21 U.S.C. section 360bbb-3(b)(1), unless the authorization is terminated or revoked sooner.    Influenza A by PCR NEGATIVE NEGATIVE Final   Influenza B by PCR NEGATIVE NEGATIVE Final    Comment: (NOTE) The Xpert Xpress SARS-CoV-2/FLU/RSV assay is intended as an aid in  the diagnosis of influenza  from Nasopharyngeal swab specimens and  should not be used as a sole basis for treatment. Nasal washings and  aspirates are unacceptable for Xpert Xpress SARS-CoV-2/FLU/RSV  testing. Fact Sheet for Patients: PinkCheek.be Fact Sheet for Healthcare Providers: GravelBags.it This test is not yet approved or cleared by the [place] FDA and  has been authorized for detection and/or diagnosis of SARS-CoV-2 by  FDA under an Emergency Use Authorization (EUA). This EUA will remain  in effect (meaning this test can be used) for the duration of the  Covid-19 declaration under Section 564(b)(1) of the Act, 21  U.S.C. section 360bbb-3(b)(1), unless the authorization is  terminated or revoked. Performed at [facility], [address] [place], [place] [address]   MRSA PCR Screening     Status: None   Collection Time: 10/02/19  3:45 AM   Specimen: Nasal Mucosa; Nasopharyngeal  Result Value Ref Range Status   MRSA by PCR NEGATIVE NEGATIVE Final    Comment:        The GeneXpert MRSA Assay (FDA approved for NASAL specimens only), is one component of a comprehensive MRSA colonization surveillance program. It is not intended to diagnose MRSA infection nor to guide or monitor treatment for MRSA infections. Performed at [facility], [place] [address], [place], [place] [address]   SARS CORONAVIRUS 2 (TAT 6-24 HRS) Nasopharyngeal Nasopharyngeal Swab     Status: None   Collection Time: 10/06/19  9:30 AM   Specimen: Nasopharyngeal Swab  Result Value Ref Range Status   SARS Coronavirus 2 NEGATIVE NEGATIVE Final    Comment: (NOTE) SARS-CoV-2 target nucleic acids are NOT DETECTED. The SARS-CoV-2 RNA is generally detectable in upper and lower respiratory specimens during the acute phase of infection. Negative results do not preclude SARS-CoV-2 infection, do not rule out co-infections with other pathogens, and should not be  used as the sole basis for treatment or other patient management decisions. Negative results must be combined with clinical observations, patient history, and epidemiological information. The expected result is Negative. Fact Sheet for Patients: SugarRoll.be Fact Sheet for Healthcare Providers: https://www.woods-mathews.com/ This test is not yet approved or cleared by the [place] FDA and  has been authorized for detection and/or diagnosis of SARS-CoV-2 by FDA under an Emergency Use Authorization (EUA). This EUA will remain  in effect (meaning this test can be used) for the duration of the COVID-19 declaration under Section 56 4(b)(1) of the Act, 21 U.S.C. section 360bbb-3(b)(1), unless the authorization is terminated or revoked sooner. Performed  at [facility], [place] [address] [place], [place] [address]      Patient was seen and examined on the day of discharge and was found to be in stable condition. Time coordinating discharge: 40 minutes including assessment and coordination of care, as well as examination of the patient.   SIGNED:  Dessa Phi, DO [facility] 10/06/2019, 1:57 PM

## 2019-10-07 DIAGNOSIS — S7292XD Unspecified fracture of left femur, subsequent encounter for closed fracture with routine healing: Secondary | ICD-10-CM | POA: Diagnosis not present

## 2019-10-07 DIAGNOSIS — G14 Postpolio syndrome: Secondary | ICD-10-CM

## 2019-10-07 DIAGNOSIS — I10 Essential (primary) hypertension: Secondary | ICD-10-CM | POA: Diagnosis not present

## 2019-10-07 LAB — CBC
HCT: 29.9 % — ABNORMAL LOW (ref 36.0–46.0)
Hemoglobin: 9.9 g/dL — ABNORMAL LOW (ref 12.0–15.0)
MCH: 31 pg (ref 26.0–34.0)
MCHC: 33.1 g/dL (ref 30.0–36.0)
MCV: 93.7 fL (ref 80.0–100.0)
Platelets: 174 10*3/uL (ref 150–400)
RBC: 3.19 MIL/uL — ABNORMAL LOW (ref 3.87–5.11)
RDW: 13.5 % (ref 11.5–15.5)
WBC: 7.5 10*3/uL (ref 4.0–10.5)
nRBC: 0 % (ref 0.0–0.2)

## 2019-10-07 NOTE — Plan of Care (Addendum)
Pt discharging to [facility], gave report to facility at 1020. Discharge instructions explained to pt and pt verbalized understanding. Packed all personal belongings. No further questions or concerns voiced. Awaiting [facility] for transportation.   Problem: Education: Goal: Knowledge of General Education information will improve Description: Including pain rating scale, medication(s)/side effects and non-pharmacologic comfort measures Outcome: Completed/Met   Problem: Health Behavior/Discharge Planning: Goal: Ability to manage health-related needs will improve Outcome: Completed/Met   Problem: Clinical Measurements: Goal: Ability to maintain clinical measurements within normal limits will improve Outcome: Completed/Met Goal: Will remain free from infection Outcome: Completed/Met Goal: Diagnostic test results will improve Outcome: Completed/Met Goal: Respiratory complications will improve Outcome: Completed/Met Goal: Cardiovascular complication will be avoided Outcome: Completed/Met   Problem: Activity: Goal: Risk for activity intolerance will decrease Outcome: Completed/Met   Problem: Nutrition: Goal: Adequate nutrition will be maintained Outcome: Completed/Met   Problem: Coping: Goal: Level of anxiety will decrease Outcome: Completed/Met   Problem: Elimination: Goal: Will not experience complications related to bowel motility Outcome: Completed/Met Goal: Will not experience complications related to urinary retention Outcome: Completed/Met   Problem: Pain Managment: Goal: General experience of comfort will improve Outcome: Completed/Met   Problem: Safety: Goal: Ability to remain free from injury will improve Outcome: Completed/Met   Problem: Skin Integrity: Goal: Risk for impaired skin integrity will decrease Outcome: Completed/Met

## 2019-10-07 NOTE — TOC Transition Note (Signed)
Transition of Care [facility]) - CM/SW Discharge Note   Patient Details  Name: Monica Garrison MRN: [id number] Date of Birth: 1946-08-14  Transition [facility]) CM/SW Contact:  Midge Minium MSN, RN, NCM-BC, ACM-RN [phone] Phone Number: 10/07/2019, 9:33 AM   Clinical Narrative:    Patient medically stable to transition to [facility] for [facility] rehab. Discharge paperwork faxed to the Admissions Coordinator, Seth Bake. PTAR arranged for 1000.  Please call report to: [phone]; Room 307A   Final next level of care: Skilled Nursing Facility Barriers to Discharge: No Barriers Identified   Patient Goals and CMS Choice Patient states their goals for this hospitalization and ongoing recovery are:: "return home and to painting" CMS Medicare.gov Compare Post Acute Care list provided to:: Patient Choice offered to / list presented to : Patient  Discharge Placement              Patient chooses bed at: [facility] Patient to be transferred to facility by: [place] Name of family member notified: patient Patient and family notified of of transfer: 10/07/19  Discharge Plan and Services In-house Referral: NA Discharge Planning Services: CM Consult Post Acute Care Choice: [place]          DME Arranged: N/A DME Agency: NA       HH Arranged: NA HH Agency: NA        Social Determinants of Health (SDOH) Interventions     Readmission Risk Interventions No flowsheet data found.

## 2019-10-07 NOTE — Plan of Care (Addendum)
  Problem: Skin Integrity: Goal: Risk for impaired skin integrity will decrease Outcome: Progressing   Problem: Safety: Goal: Ability to remain free from injury will improve Outcome: Progressing   Problem: Pain Managment: Goal: General experience of comfort will improve Outcome: Progressing   Problem: Elimination: Goal: Will not experience complications related to bowel motility Outcome: Progressing    Noted morning vitals with elevated systolic at [id number].  Prn orders for hydralazine to give if systolic is at or greater than [id number] or if diastolic is greater than [id number].  Will continue to monitor and update day shift nurse.

## 2019-10-07 NOTE — Progress Notes (Signed)
  PROGRESS NOTE  Patient evaluated today. She is doing well without acute complaints. Ready for dc to SNF. Updated DC summary.    Dessa Phi, DO [facility] 10/07/2019, 8:26 AM  Available via Epic secure chat 7am-7pm After these hours, please refer to coverage provider listed on [email or web address]

## 2019-10-07 NOTE — Discharge Instructions (Signed)
[facility] Discharge Instructions   General Discharge Instructions  WEIGHT BEARING STATUS: Touchdown weightbearing on left leg   RANGE OF MOTION/ACTIVITY: okay for gentle knee range of motion as you tolerate  Wound Care: Incisions can be left open to air if there is no drainage.Leave steri-strips in place. If incision continues to have drainage, follow wound care instructions below. Okay to shower if no drainage from incisions. Okay to get steri-strips wet while showering  DVT/PE prophylaxis: Eliquis twice daily x 30 days  Diet: as you were eating previously.  Can use over the counter stool softeners and bowel preparations, such as Miralax, to help with bowel movements.  Narcotics can be constipating.  Be sure to drink plenty of fluids  PAIN MEDICATION USE AND EXPECTATIONS  You have likely been given narcotic medications to help control your pain.  After a traumatic event that results in an fracture (broken bone) with or without surgery, it is ok to use narcotic pain medications to help control one's pain.  We understand that everyone responds to pain differently and each individual patient will be evaluated on a regular basis for the continued need for narcotic medications. Ideally, narcotic medication use should last no more than 6-8 weeks (coinciding with fracture healing).   As a patient it is your responsibility as well to monitor narcotic medication use and report the amount and frequency you use these medications when you come to your office visit.   We would also advise that if you are using narcotic medications, you should take a dose prior to therapy to maximize you participation.  IF YOU ARE ON NARCOTIC MEDICATIONS IT IS NOT PERMISSIBLE TO OPERATE A MOTOR VEHICLE (MOTORCYCLE/CAR/TRUCK/MOPED) OR HEAVY MACHINERY DO NOT MIX NARCOTICS WITH OTHER CNS (CENTRAL NERVOUS SYSTEM) DEPRESSANTS SUCH AS ALCOHOL   STOP SMOKING OR USING NICOTINE PRODUCTS!!!!  As discussed  nicotine severely impairs your body's ability to heal surgical and traumatic wounds but also impairs bone healing.  Wounds and bone heal by forming microscopic blood vessels (angiogenesis) and nicotine is a vasoconstrictor (essentially, shrinks blood vessels).  Therefore, if vasoconstriction occurs to these microscopic blood vessels they essentially disappear and are unable to deliver necessary nutrients to the healing tissue.  This is one modifiable factor that you can do to dramatically increase your chances of healing your injury.    (This means no smoking, no nicotine gum, patches, etc)  DO NOT USE NONSTEROIDAL ANTI-INFLAMMATORY DRUGS (NSAID'S)  Using products such as Advil (ibuprofen), Aleve (naproxen), Motrin (ibuprofen) for additional pain control during fracture healing can delay and/or prevent the healing response.  If you would like to take over the counter (OTC) medication, Tylenol (acetaminophen) is ok.  However, some narcotic medications that are given for pain control contain acetaminophen as well. Therefore, you should not exceed more than 4000 mg of tylenol in a day if you do not have liver disease.  Also note that there are may OTC medicines, such as cold medicines and allergy medicines that my contain tylenol as well.  If you have any questions about medications and/or interactions please ask your doctor/PA or your pharmacist.      ICE AND ELEVATE INJURED/OPERATIVE EXTREMITY  Using ice and elevating the injured extremity above your heart can help with swelling and pain control.  Icing in a pulsatile fashion, such as 20 minutes on and 20 minutes off, can be followed.    Do not place ice directly on skin. Make sure there is a barrier between  to skin and the ice pack.    Using frozen items such as frozen peas works well as the conform nicely to the are that needs to be iced.  USE AN ACE WRAP OR TED HOSE FOR SWELLING CONTROL  In addition to icing and elevation, Ace wraps or TED hose are  used to help limit and resolve swelling.  It is recommended to use Ace wraps or TED hose until you are informed to stop.    When using Ace Wraps start the wrapping distally (farthest away from the body) and wrap proximally (closer to the body)   Example: If you had surgery on your leg or thing and you do not have a splint on, start the ace wrap at the toes and work your way up to the thigh        If you had surgery on your upper extremity and do not have a splint on, start the ace wrap at your fingers and work your way up to the upper arm    [place]: [phone]   VISIT OUR WEBSITE FOR ADDITIONAL INFORMATION: [email or web address]     Discharge Wound Care Instructions  Do NOT apply any ointments, solutions or lotions to pin sites or surgical wounds.  These prevent needed drainage and even though solutions like hydrogen peroxide kill bacteria, they also damage cells lining the pin sites that help fight infection.  Applying lotions or ointments can keep the wounds moist and can cause them to breakdown and open up as well. This can increase the risk for infection. When in doubt call the office.  Surgical incisions should be dressed daily.  If any drainage is noted, use one layer of adaptic, then gauze, Kerlix, and an ace wrap.  Once the incision is completely dry and without drainage, it may be left open to air out.  Showering may begin 36-48 hours later.  Cleaning gently with soap and water.  Traumatic wounds should be dressed daily as well.    One layer of adaptic, gauze, Kerlix, then ace wrap.  The adaptic can be discontinued once the draining has ceased    If you have a wet to dry dressing: wet the gauze with saline the squeeze as much saline out so the gauze is moist (not soaking wet), place moistened gauze over wound, then place a dry gauze over the moist one, followed by Kerlix wrap, then ace wrap.    Information on my medicine - ELIQUIS  (apixaban)  This medication education was reviewed with me or my healthcare representative as part of my discharge preparation.  Why was Eliquis prescribed for you? Eliquis was prescribed for you to reduce the risk of blood clots forming after orthopedic surgery.    What do You need to know about Eliquis? Take your Eliquis TWICE DAILY - one tablet in the morning and one tablet in the evening with or without food.  It would be best to take the dose about the same time each day.  If you have difficulty swallowing the tablet whole please discuss with your pharmacist how to take the medication safely.  Take Eliquis exactly as prescribed by your doctor and DO NOT stop taking Eliquis without talking to the doctor who prescribed the medication.  Stopping without other medication to take the place of Eliquis may increase your risk of developing a clot.  After discharge, you should have regular check-up appointments with your healthcare provider that is prescribing your Eliquis.  What do you do if you miss a dose? If a dose of ELIQUIS is not taken at the scheduled time, take it as soon as possible on the same day and twice-daily administration should be resumed.  The dose should not be doubled to make up for a missed dose.  Do not take more than one tablet of ELIQUIS at the same time.  Important Safety Information A possible side effect of Eliquis is bleeding. You should call your healthcare provider right away if you experience any of the following: ? Bleeding from an injury or your nose that does not stop. ? Unusual colored urine (red or dark brown) or unusual colored stools (red or black). ? Unusual bruising for unknown reasons. ? A serious fall or if you hit your head (even if there is no bleeding).  Some medicines may interact with Eliquis and might increase your risk of bleeding or clotting while on Eliquis. To help avoid this, consult your healthcare provider or pharmacist prior to  using any new prescription or non-prescription medications, including herbals, vitamins, non-steroidal anti-inflammatory drugs (NSAIDs) and supplements.  This website has more information on Eliquis (apixaban): http://www.eliquis.com/eliquis/home

## 2019-10-10 ENCOUNTER — Encounter: Payer: Self-pay | Admitting: Student

## 2019-10-10 DIAGNOSIS — S82045A Nondisplaced comminuted fracture of left patella, initial encounter for closed fracture: Secondary | ICD-10-CM | POA: Insufficient documentation

## 2019-10-10 DIAGNOSIS — G14 Postpolio syndrome: Secondary | ICD-10-CM | POA: Insufficient documentation

## 2019-10-21 DIAGNOSIS — S7292XD Unspecified fracture of left femur, subsequent encounter for closed fracture with routine healing: Secondary | ICD-10-CM | POA: Diagnosis not present

## 2019-10-21 DIAGNOSIS — G14 Postpolio syndrome: Secondary | ICD-10-CM | POA: Diagnosis not present

## 2019-10-21 DIAGNOSIS — I10 Essential (primary) hypertension: Secondary | ICD-10-CM | POA: Diagnosis not present

## 2020-05-28 IMAGING — RF DG C-ARM 1-60 MIN
1 series · 6 of 6 positions shown · non-contrast
Comparison: 10/01/2019

CLINICAL DATA: Distal right femur fracture ORIF

EXAM:
DG C-ARM 1-60 MIN; LEFT FEMUR 2 VIEWS

[Series 1: run · 6 of 6 slices shown]
[im 1/6]
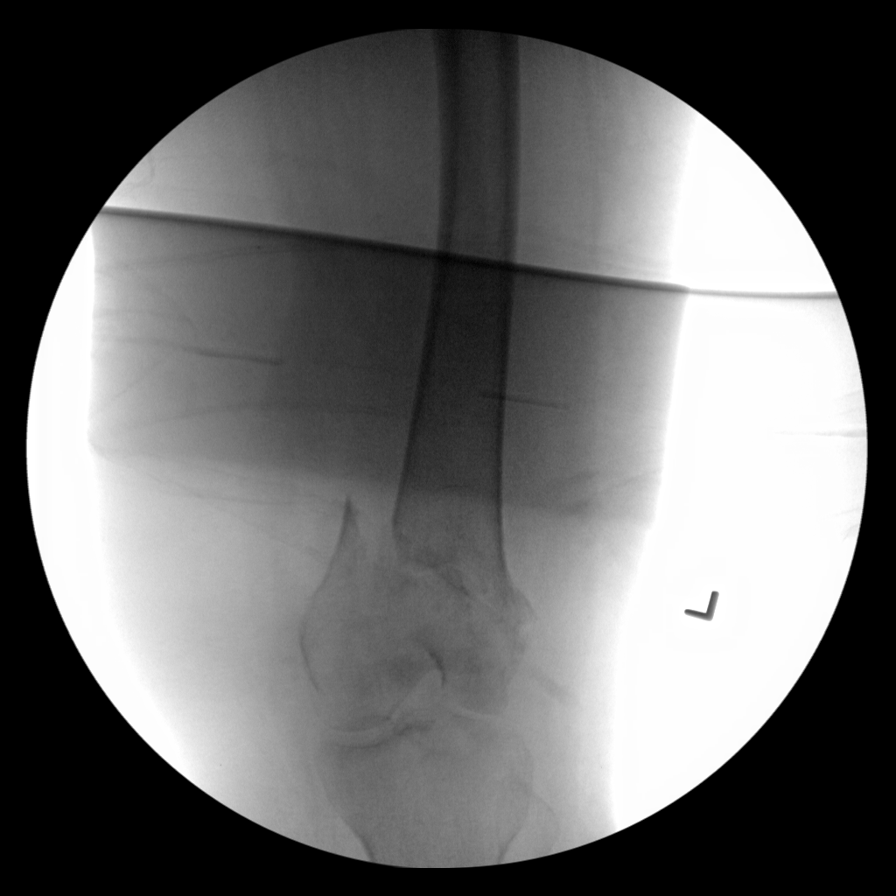
[im 2/6]
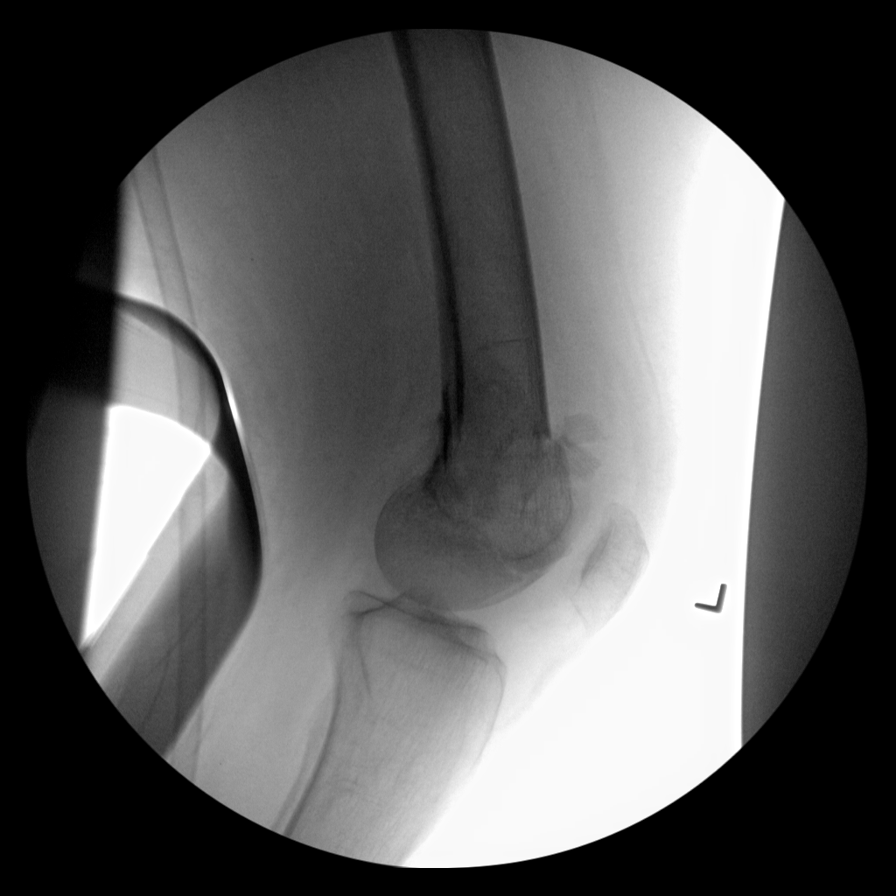
[im 3/6]
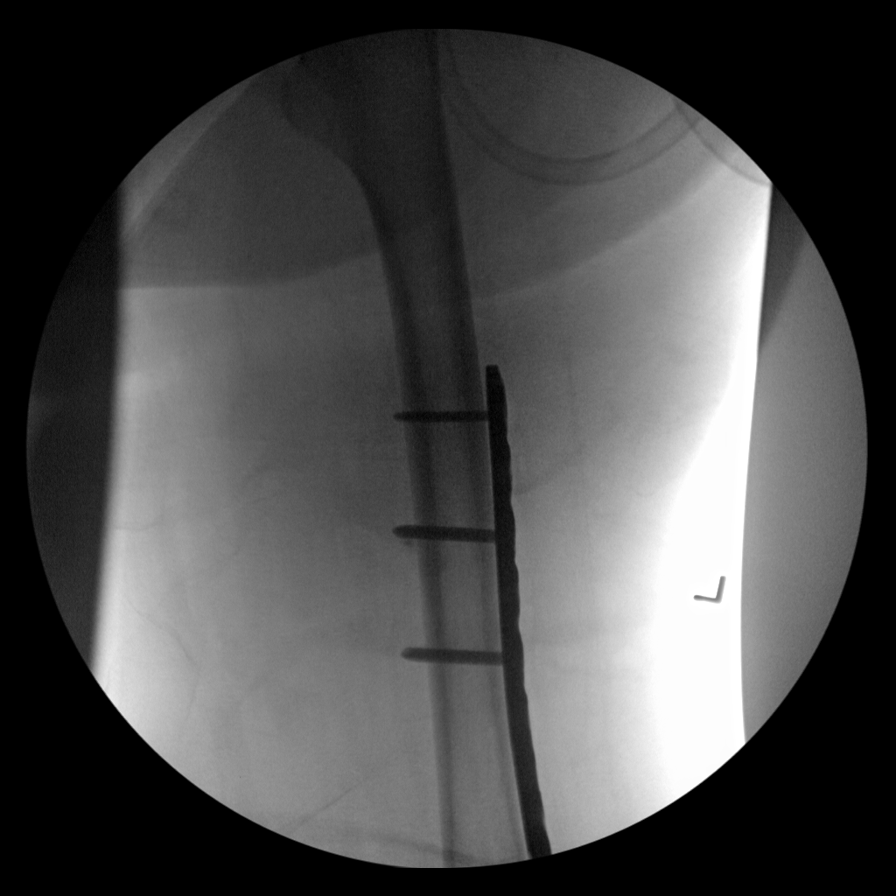
[im 4/6]
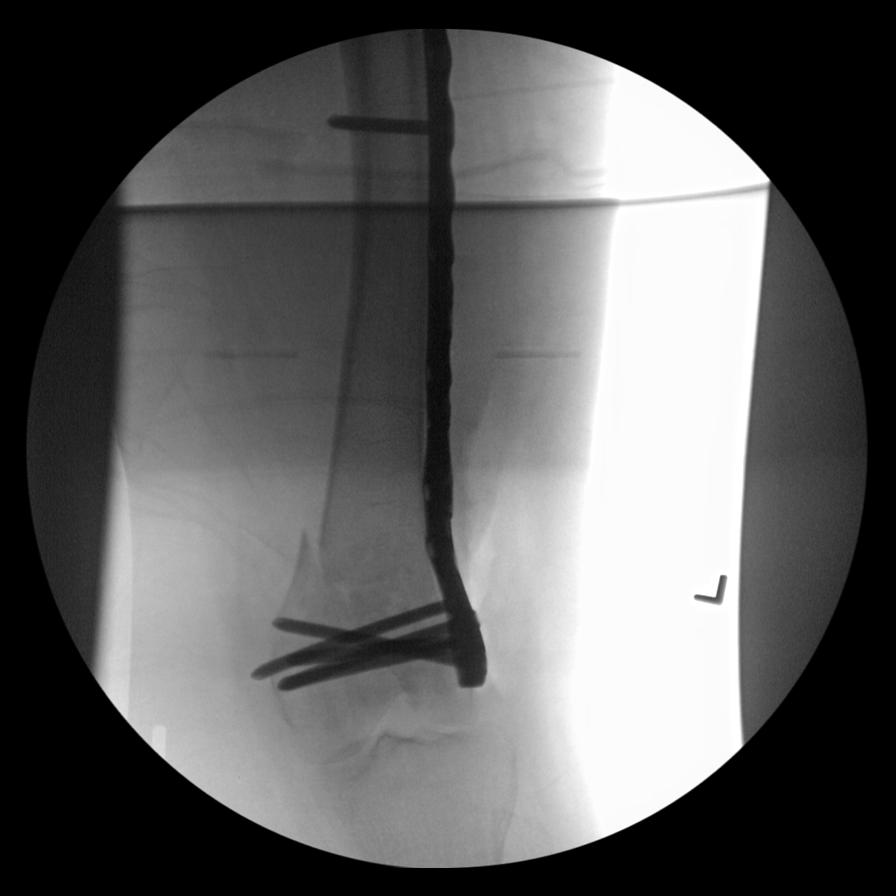
[im 5/6]
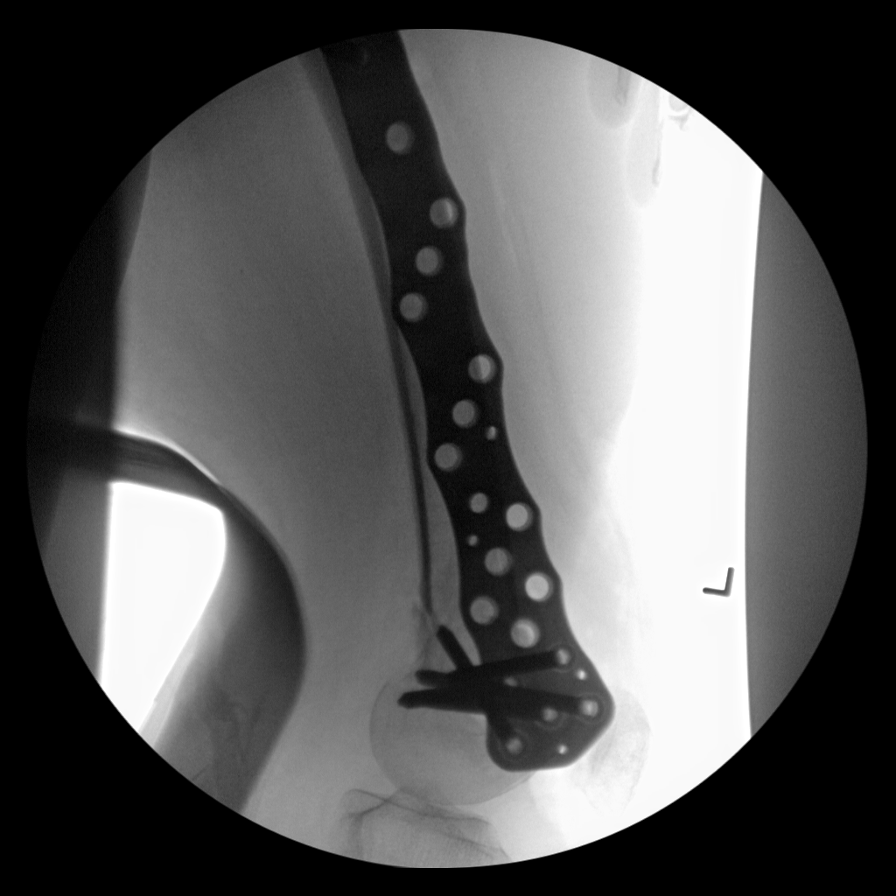
[im 6/6]
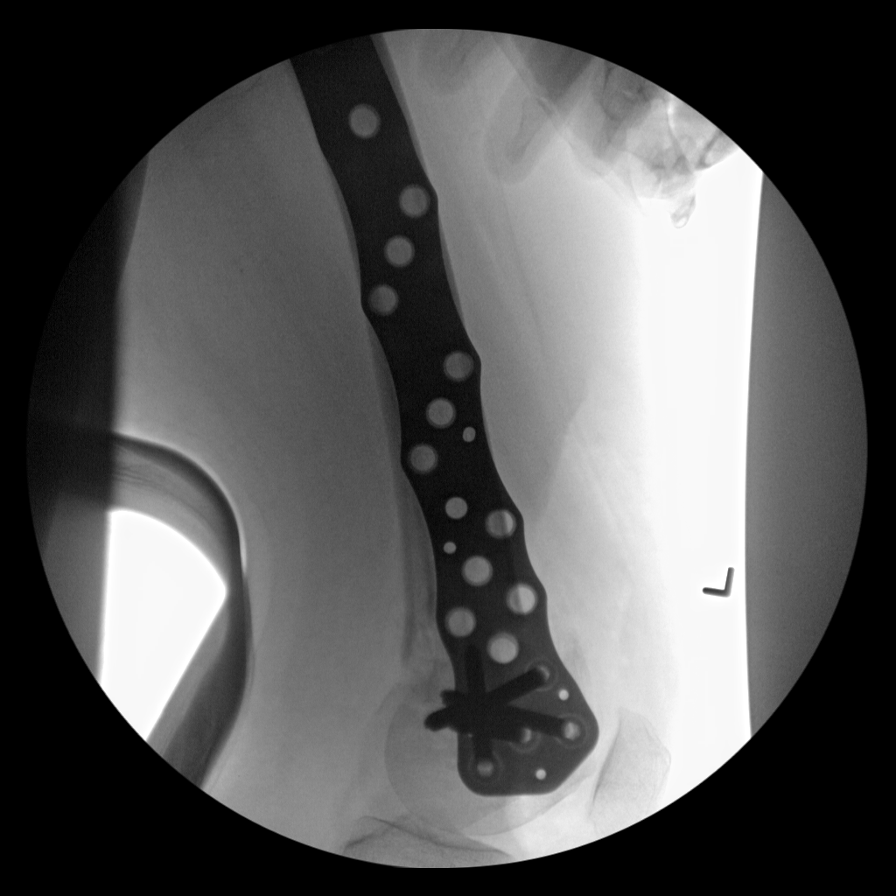

[6 of 6 positions shown; findings below may reference images not displayed]

FINDINGS: 6 C-arm fluoroscopic images were obtained intraoperatively and
submitted for post operative interpretation. Comminuted mildly
displaced distal left femoral metaphyseal fracture with subsequent
placement of lateral sideplate and screw fixation construct.
Improved fracture alignment status post hardware placement without
evidence of complication on the provided images. Fluoroscopy time of
2 minutes and 12 seconds was utilized. Please see the performing
provider's procedural report for further detail.
IMPRESSION: As above.
